# Patient Record
Sex: Male | Born: 1945 | Race: White | Hispanic: No | Marital: Married | Smoking: Former smoker
Health system: Southern US, Community
[De-identification: ages and names within clinical notes are randomized; demographics above are authoritative.]

## PROBLEM LIST (undated history)

## (undated) DIAGNOSIS — J449 Chronic obstructive pulmonary disease, unspecified: Secondary | ICD-10-CM

## (undated) DIAGNOSIS — I4891 Unspecified atrial fibrillation: Secondary | ICD-10-CM

## (undated) DIAGNOSIS — F209 Schizophrenia, unspecified: Secondary | ICD-10-CM

## (undated) DIAGNOSIS — N400 Enlarged prostate without lower urinary tract symptoms: Secondary | ICD-10-CM

## (undated) HISTORY — PX: OTHER SURGICAL HISTORY: SHX169

---

## 2008-05-26 ENCOUNTER — Inpatient Hospital Stay (HOSPITAL_COMMUNITY): Admission: AD | Admit: 2008-05-26 | Discharge: 2008-06-11 | Payer: Self-pay | Admitting: Psychiatry

## 2008-05-26 ENCOUNTER — Ambulatory Visit: Payer: Self-pay | Admitting: Psychiatry

## 2011-04-19 NOTE — Discharge Summary (Signed)
John Jennings, KINNER NO.:  [id number]   MEDICAL RECORD NO.:  [id number]          PATIENT TYPE:  IPS   LOCATION:  0402                          FACILITY:  [facility]   PHYSICIAN:  Anselm Jungling, MD  DATE OF BIRTH:  Nov 24, 1946   DATE OF ADMISSION:  05/26/2008  DATE OF DISCHARGE:  06/11/2008                               DISCHARGE SUMMARY   IDENTIFYING DATA AND REASON FOR ADMISSION:  The patient is a 65 year old  married Caucasian male who was admitted on an involuntary basis.  He had  been referred by his psychiatrist for threatening behaviors towards his  wife.  He had a long history of psychiatric disorder, but had been  stable for many years on a regimen of Prolixin.  His outpatient  psychiatrist felt that he was due for a drug holiday.  This led to  significant psychotic decompensation.  Please refer to the admission  note for further details pertaining to the symptoms, circumstances and  history that led to his hospitalization.  He was given an initial Axis I  diagnosis of psychosis NOS.   MEDICAL AND LABORATORY:  The patient was medically and physically  assessed by the psychiatric nurse practitioner.  He was continued on his  regimen of Actos, Zetia and Zocor.  There were no significant medical  issues during this stay.   HOSPITAL COURSE:  The patient was admitted to the adult inpatient  psychiatric service.  He presented as a well-nourished, normally  developed, elderly male who was alert, directable, but showed pressured  speech, poor hygiene and guarded affect.  He was intermittently  agitated.  His mood was mildly elevated.  His thoughts were disorganized  with religious themes.  He was generally cooperative with treatment.   He was restarted on Prolixin in an oral dose that was eventually  increased to 20 mg daily at bedtime.  He was also initiated on Prolixin  decanoate 50 mg IM q. 2 weeks, Cogentin 1 mg b.i.d. and trazodone 100 mg  daily at bedtime  were given as well.   The patient improved gradually over the following 3 weeks.  His family  was involved, including his wife, son and daughter.   The patient was able to be discharged after approximately 16 days in the  inpatient setting.  At that time, he had been consistently calm,  pleasant and cooperative for many days.  He was not irritable and  appeared to be tolerating his medication well.   AFTERCARE:  The patient was to follow up at [facility] with an appointment on June 13, 2008.   DISCHARGE MEDICATIONS:  1. Prolixin 20 mg daily at bedtime.  2. Prolixin D 50 mg IM q. 2 weeks, next on June 24, 2008.  3. Trazodone 100 mg daily at bedtime.  4. Cogentin 1 mg b.i.d.  5. Actos 15 mg daily.  6. Zetia 10 mg daily.  7. Zocor 40 mg daily.   DISCHARGE DIAGNOSES:  AXIS I:  Schizophrenia, chronic paranoid type,  acute exacerbation, resolving.  AXIS II:  Deferred.  AXIS  III:  History of hypertension, hyperlipidemia.  AXIS IV:  Stressors severe.  AXIS V:  GAF on discharge 50.   The patient was instructed not to drive automobiles following his  hospital stay.  This was written on his discharge instruction sheet.      Anselm Jungling, MD  Electronically Signed     SPB/MEDQ  D:  06/16/2008  T:  06/16/2008  Job:  [id number]

## 2017-11-08 DIAGNOSIS — F0391 Unspecified dementia with behavioral disturbance: Secondary | ICD-10-CM

## 2017-11-08 DIAGNOSIS — R509 Fever, unspecified: Secondary | ICD-10-CM

## 2017-11-08 DIAGNOSIS — R05 Cough: Secondary | ICD-10-CM

## 2017-11-09 DIAGNOSIS — F0391 Unspecified dementia with behavioral disturbance: Secondary | ICD-10-CM | POA: Diagnosis not present

## 2017-11-09 DIAGNOSIS — R509 Fever, unspecified: Secondary | ICD-10-CM | POA: Diagnosis not present

## 2017-11-09 DIAGNOSIS — R05 Cough: Secondary | ICD-10-CM | POA: Diagnosis not present

## 2017-11-10 DIAGNOSIS — R509 Fever, unspecified: Secondary | ICD-10-CM | POA: Diagnosis not present

## 2017-11-10 DIAGNOSIS — F0391 Unspecified dementia with behavioral disturbance: Secondary | ICD-10-CM | POA: Diagnosis not present

## 2017-11-10 DIAGNOSIS — R05 Cough: Secondary | ICD-10-CM | POA: Diagnosis not present

## 2017-11-11 DIAGNOSIS — R509 Fever, unspecified: Secondary | ICD-10-CM

## 2017-12-28 DIAGNOSIS — F0390 Unspecified dementia without behavioral disturbance: Secondary | ICD-10-CM | POA: Diagnosis not present

## 2017-12-28 DIAGNOSIS — E7800 Pure hypercholesterolemia, unspecified: Secondary | ICD-10-CM | POA: Diagnosis not present

## 2017-12-28 DIAGNOSIS — F200 Paranoid schizophrenia: Secondary | ICD-10-CM | POA: Diagnosis not present

## 2017-12-28 DIAGNOSIS — J181 Lobar pneumonia, unspecified organism: Secondary | ICD-10-CM | POA: Diagnosis not present

## 2017-12-28 DIAGNOSIS — J440 Chronic obstructive pulmonary disease with acute lower respiratory infection: Secondary | ICD-10-CM

## 2017-12-28 DIAGNOSIS — J9621 Acute and chronic respiratory failure with hypoxia: Secondary | ICD-10-CM

## 2017-12-28 DIAGNOSIS — E119 Type 2 diabetes mellitus without complications: Secondary | ICD-10-CM | POA: Diagnosis not present

## 2017-12-29 DIAGNOSIS — J9621 Acute and chronic respiratory failure with hypoxia: Secondary | ICD-10-CM | POA: Diagnosis not present

## 2017-12-29 DIAGNOSIS — E119 Type 2 diabetes mellitus without complications: Secondary | ICD-10-CM | POA: Diagnosis not present

## 2017-12-29 DIAGNOSIS — E7800 Pure hypercholesterolemia, unspecified: Secondary | ICD-10-CM | POA: Diagnosis not present

## 2017-12-29 DIAGNOSIS — F0390 Unspecified dementia without behavioral disturbance: Secondary | ICD-10-CM | POA: Diagnosis not present

## 2017-12-29 DIAGNOSIS — F200 Paranoid schizophrenia: Secondary | ICD-10-CM | POA: Diagnosis not present

## 2017-12-29 DIAGNOSIS — J440 Chronic obstructive pulmonary disease with acute lower respiratory infection: Secondary | ICD-10-CM | POA: Diagnosis not present

## 2017-12-29 DIAGNOSIS — J181 Lobar pneumonia, unspecified organism: Secondary | ICD-10-CM | POA: Diagnosis not present

## 2017-12-30 DIAGNOSIS — F0390 Unspecified dementia without behavioral disturbance: Secondary | ICD-10-CM | POA: Diagnosis not present

## 2017-12-30 DIAGNOSIS — E119 Type 2 diabetes mellitus without complications: Secondary | ICD-10-CM | POA: Diagnosis not present

## 2017-12-30 DIAGNOSIS — J440 Chronic obstructive pulmonary disease with acute lower respiratory infection: Secondary | ICD-10-CM | POA: Diagnosis not present

## 2017-12-30 DIAGNOSIS — E7800 Pure hypercholesterolemia, unspecified: Secondary | ICD-10-CM | POA: Diagnosis not present

## 2017-12-30 DIAGNOSIS — F200 Paranoid schizophrenia: Secondary | ICD-10-CM | POA: Diagnosis not present

## 2017-12-30 DIAGNOSIS — J181 Lobar pneumonia, unspecified organism: Secondary | ICD-10-CM | POA: Diagnosis not present

## 2017-12-30 DIAGNOSIS — J9621 Acute and chronic respiratory failure with hypoxia: Secondary | ICD-10-CM | POA: Diagnosis not present

## 2017-12-31 DIAGNOSIS — J9621 Acute and chronic respiratory failure with hypoxia: Secondary | ICD-10-CM | POA: Diagnosis not present

## 2017-12-31 DIAGNOSIS — J181 Lobar pneumonia, unspecified organism: Secondary | ICD-10-CM | POA: Diagnosis not present

## 2017-12-31 DIAGNOSIS — F0390 Unspecified dementia without behavioral disturbance: Secondary | ICD-10-CM | POA: Diagnosis not present

## 2017-12-31 DIAGNOSIS — F200 Paranoid schizophrenia: Secondary | ICD-10-CM | POA: Diagnosis not present

## 2017-12-31 DIAGNOSIS — E119 Type 2 diabetes mellitus without complications: Secondary | ICD-10-CM | POA: Diagnosis not present

## 2017-12-31 DIAGNOSIS — E7800 Pure hypercholesterolemia, unspecified: Secondary | ICD-10-CM | POA: Diagnosis not present

## 2017-12-31 DIAGNOSIS — J440 Chronic obstructive pulmonary disease with acute lower respiratory infection: Secondary | ICD-10-CM | POA: Diagnosis not present

## 2018-03-18 DIAGNOSIS — F200 Paranoid schizophrenia: Secondary | ICD-10-CM | POA: Diagnosis not present

## 2018-03-18 DIAGNOSIS — R079 Chest pain, unspecified: Secondary | ICD-10-CM | POA: Diagnosis not present

## 2018-03-18 DIAGNOSIS — I491 Atrial premature depolarization: Secondary | ICD-10-CM

## 2018-03-18 DIAGNOSIS — J849 Interstitial pulmonary disease, unspecified: Secondary | ICD-10-CM | POA: Diagnosis not present

## 2018-03-18 DIAGNOSIS — R0600 Dyspnea, unspecified: Secondary | ICD-10-CM

## 2018-03-18 DIAGNOSIS — J9611 Chronic respiratory failure with hypoxia: Secondary | ICD-10-CM | POA: Diagnosis not present

## 2018-03-19 DIAGNOSIS — J849 Interstitial pulmonary disease, unspecified: Secondary | ICD-10-CM | POA: Diagnosis not present

## 2018-03-19 DIAGNOSIS — R079 Chest pain, unspecified: Secondary | ICD-10-CM

## 2018-03-19 DIAGNOSIS — F200 Paranoid schizophrenia: Secondary | ICD-10-CM | POA: Diagnosis not present

## 2018-03-19 DIAGNOSIS — R0600 Dyspnea, unspecified: Secondary | ICD-10-CM

## 2018-03-19 DIAGNOSIS — J9611 Chronic respiratory failure with hypoxia: Secondary | ICD-10-CM | POA: Diagnosis not present

## 2018-03-19 DIAGNOSIS — I491 Atrial premature depolarization: Secondary | ICD-10-CM

## 2018-03-20 DIAGNOSIS — F200 Paranoid schizophrenia: Secondary | ICD-10-CM | POA: Diagnosis not present

## 2018-03-20 DIAGNOSIS — J9611 Chronic respiratory failure with hypoxia: Secondary | ICD-10-CM | POA: Diagnosis not present

## 2018-03-20 DIAGNOSIS — R079 Chest pain, unspecified: Secondary | ICD-10-CM | POA: Diagnosis not present

## 2018-03-20 DIAGNOSIS — J849 Interstitial pulmonary disease, unspecified: Secondary | ICD-10-CM | POA: Diagnosis not present

## 2018-06-05 DIAGNOSIS — E119 Type 2 diabetes mellitus without complications: Secondary | ICD-10-CM

## 2018-06-05 DIAGNOSIS — R0609 Other forms of dyspnea: Secondary | ICD-10-CM

## 2018-06-05 DIAGNOSIS — R748 Abnormal levels of other serum enzymes: Secondary | ICD-10-CM

## 2018-06-05 DIAGNOSIS — I509 Heart failure, unspecified: Secondary | ICD-10-CM

## 2018-06-05 DIAGNOSIS — J9621 Acute and chronic respiratory failure with hypoxia: Secondary | ICD-10-CM

## 2018-06-05 DIAGNOSIS — I4892 Unspecified atrial flutter: Secondary | ICD-10-CM

## 2018-06-05 DIAGNOSIS — E872 Acidosis: Secondary | ICD-10-CM

## 2018-06-05 DIAGNOSIS — J449 Chronic obstructive pulmonary disease, unspecified: Secondary | ICD-10-CM

## 2018-06-06 DIAGNOSIS — I4892 Unspecified atrial flutter: Secondary | ICD-10-CM | POA: Diagnosis not present

## 2018-06-06 DIAGNOSIS — J9621 Acute and chronic respiratory failure with hypoxia: Secondary | ICD-10-CM | POA: Diagnosis not present

## 2018-06-06 DIAGNOSIS — I509 Heart failure, unspecified: Secondary | ICD-10-CM | POA: Diagnosis not present

## 2018-06-06 DIAGNOSIS — J449 Chronic obstructive pulmonary disease, unspecified: Secondary | ICD-10-CM | POA: Diagnosis not present

## 2018-06-07 DIAGNOSIS — J449 Chronic obstructive pulmonary disease, unspecified: Secondary | ICD-10-CM | POA: Diagnosis not present

## 2018-06-07 DIAGNOSIS — I4892 Unspecified atrial flutter: Secondary | ICD-10-CM | POA: Diagnosis not present

## 2018-06-07 DIAGNOSIS — I509 Heart failure, unspecified: Secondary | ICD-10-CM | POA: Diagnosis not present

## 2018-06-07 DIAGNOSIS — J9621 Acute and chronic respiratory failure with hypoxia: Secondary | ICD-10-CM | POA: Diagnosis not present

## 2018-08-26 DIAGNOSIS — R0602 Shortness of breath: Secondary | ICD-10-CM | POA: Diagnosis not present

## 2018-08-27 DIAGNOSIS — I350 Nonrheumatic aortic (valve) stenosis: Secondary | ICD-10-CM | POA: Diagnosis not present

## 2018-08-27 DIAGNOSIS — J449 Chronic obstructive pulmonary disease, unspecified: Secondary | ICD-10-CM

## 2018-08-27 DIAGNOSIS — I509 Heart failure, unspecified: Secondary | ICD-10-CM | POA: Diagnosis not present

## 2018-08-27 DIAGNOSIS — I4891 Unspecified atrial fibrillation: Secondary | ICD-10-CM

## 2018-08-27 DIAGNOSIS — R0602 Shortness of breath: Secondary | ICD-10-CM

## 2018-08-27 DIAGNOSIS — I5031 Acute diastolic (congestive) heart failure: Secondary | ICD-10-CM

## 2018-08-27 DIAGNOSIS — J9621 Acute and chronic respiratory failure with hypoxia: Secondary | ICD-10-CM

## 2018-08-27 DIAGNOSIS — R748 Abnormal levels of other serum enzymes: Secondary | ICD-10-CM

## 2018-08-27 DIAGNOSIS — R931 Abnormal findings on diagnostic imaging of heart and coronary circulation: Secondary | ICD-10-CM | POA: Diagnosis not present

## 2018-08-27 DIAGNOSIS — I517 Cardiomegaly: Secondary | ICD-10-CM

## 2018-08-27 DIAGNOSIS — E872 Acidosis: Secondary | ICD-10-CM

## 2018-08-28 DIAGNOSIS — I509 Heart failure, unspecified: Secondary | ICD-10-CM | POA: Diagnosis not present

## 2018-08-28 DIAGNOSIS — F209 Schizophrenia, unspecified: Secondary | ICD-10-CM

## 2018-08-28 DIAGNOSIS — I4891 Unspecified atrial fibrillation: Secondary | ICD-10-CM | POA: Diagnosis not present

## 2018-08-28 DIAGNOSIS — I255 Ischemic cardiomyopathy: Secondary | ICD-10-CM

## 2018-08-28 DIAGNOSIS — R931 Abnormal findings on diagnostic imaging of heart and coronary circulation: Secondary | ICD-10-CM | POA: Diagnosis not present

## 2018-08-29 DIAGNOSIS — F209 Schizophrenia, unspecified: Secondary | ICD-10-CM | POA: Diagnosis not present

## 2018-08-29 DIAGNOSIS — I255 Ischemic cardiomyopathy: Secondary | ICD-10-CM

## 2018-08-29 DIAGNOSIS — I4891 Unspecified atrial fibrillation: Secondary | ICD-10-CM | POA: Diagnosis not present

## 2018-08-29 DIAGNOSIS — I509 Heart failure, unspecified: Secondary | ICD-10-CM

## 2018-08-29 DIAGNOSIS — I471 Supraventricular tachycardia: Secondary | ICD-10-CM

## 2018-08-30 ENCOUNTER — Inpatient Hospital Stay (HOSPITAL_COMMUNITY): Payer: Medicare Other

## 2018-08-30 ENCOUNTER — Inpatient Hospital Stay (HOSPITAL_COMMUNITY)
Admission: AD | Admit: 2018-08-30 | Discharge: 2018-09-07 | DRG: 291 | Disposition: A | Discharge status: Skilled Nursing Facility | Payer: Medicare Other | Source: Skilled Nursing Facility | Attending: Family Medicine | Admitting: Family Medicine

## 2018-08-30 DIAGNOSIS — J9601 Acute respiratory failure with hypoxia: Secondary | ICD-10-CM | POA: Diagnosis not present

## 2018-08-30 DIAGNOSIS — Z8249 Family history of ischemic heart disease and other diseases of the circulatory system: Secondary | ICD-10-CM | POA: Diagnosis not present

## 2018-08-30 DIAGNOSIS — R509 Fever, unspecified: Secondary | ICD-10-CM | POA: Diagnosis not present

## 2018-08-30 DIAGNOSIS — Z9114 Patient's other noncompliance with medication regimen: Secondary | ICD-10-CM

## 2018-08-30 DIAGNOSIS — F329 Major depressive disorder, single episode, unspecified: Secondary | ICD-10-CM | POA: Diagnosis present

## 2018-08-30 DIAGNOSIS — Z66 Do not resuscitate: Secondary | ICD-10-CM | POA: Diagnosis present

## 2018-08-30 DIAGNOSIS — R0602 Shortness of breath: Secondary | ICD-10-CM

## 2018-08-30 DIAGNOSIS — F17210 Nicotine dependence, cigarettes, uncomplicated: Secondary | ICD-10-CM | POA: Diagnosis present

## 2018-08-30 DIAGNOSIS — M24542 Contracture, left hand: Secondary | ICD-10-CM | POA: Diagnosis present

## 2018-08-30 DIAGNOSIS — Z9981 Dependence on supplemental oxygen: Secondary | ICD-10-CM | POA: Diagnosis not present

## 2018-08-30 DIAGNOSIS — I471 Supraventricular tachycardia: Secondary | ICD-10-CM | POA: Diagnosis not present

## 2018-08-30 DIAGNOSIS — J441 Chronic obstructive pulmonary disease with (acute) exacerbation: Secondary | ICD-10-CM | POA: Diagnosis present

## 2018-08-30 DIAGNOSIS — Z79899 Other long term (current) drug therapy: Secondary | ICD-10-CM | POA: Diagnosis not present

## 2018-08-30 DIAGNOSIS — I361 Nonrheumatic tricuspid (valve) insufficiency: Secondary | ICD-10-CM | POA: Diagnosis not present

## 2018-08-30 DIAGNOSIS — F0390 Unspecified dementia without behavioral disturbance: Secondary | ICD-10-CM | POA: Diagnosis present

## 2018-08-30 DIAGNOSIS — I4891 Unspecified atrial fibrillation: Secondary | ICD-10-CM | POA: Diagnosis not present

## 2018-08-30 DIAGNOSIS — J8410 Pulmonary fibrosis, unspecified: Secondary | ICD-10-CM | POA: Diagnosis present

## 2018-08-30 DIAGNOSIS — N400 Enlarged prostate without lower urinary tract symptoms: Secondary | ICD-10-CM | POA: Diagnosis present

## 2018-08-30 DIAGNOSIS — I509 Heart failure, unspecified: Secondary | ICD-10-CM | POA: Insufficient documentation

## 2018-08-30 DIAGNOSIS — F209 Schizophrenia, unspecified: Secondary | ICD-10-CM | POA: Diagnosis present

## 2018-08-30 DIAGNOSIS — I255 Ischemic cardiomyopathy: Secondary | ICD-10-CM | POA: Diagnosis not present

## 2018-08-30 DIAGNOSIS — R7989 Other specified abnormal findings of blood chemistry: Secondary | ICD-10-CM | POA: Diagnosis not present

## 2018-08-30 DIAGNOSIS — E873 Alkalosis: Secondary | ICD-10-CM | POA: Diagnosis present

## 2018-08-30 DIAGNOSIS — Z7901 Long term (current) use of anticoagulants: Secondary | ICD-10-CM | POA: Diagnosis not present

## 2018-08-30 DIAGNOSIS — E119 Type 2 diabetes mellitus without complications: Secondary | ICD-10-CM | POA: Diagnosis present

## 2018-08-30 DIAGNOSIS — I5023 Acute on chronic systolic (congestive) heart failure: Secondary | ICD-10-CM | POA: Diagnosis present

## 2018-08-30 DIAGNOSIS — I480 Paroxysmal atrial fibrillation: Secondary | ICD-10-CM | POA: Diagnosis present

## 2018-08-30 DIAGNOSIS — R4182 Altered mental status, unspecified: Secondary | ICD-10-CM

## 2018-08-30 DIAGNOSIS — J690 Pneumonitis due to inhalation of food and vomit: Secondary | ICD-10-CM | POA: Diagnosis present

## 2018-08-30 DIAGNOSIS — J9621 Acute and chronic respiratory failure with hypoxia: Secondary | ICD-10-CM | POA: Diagnosis present

## 2018-08-30 DIAGNOSIS — Z915 Personal history of self-harm: Secondary | ICD-10-CM | POA: Diagnosis not present

## 2018-08-30 DIAGNOSIS — I248 Other forms of acute ischemic heart disease: Secondary | ICD-10-CM | POA: Diagnosis not present

## 2018-08-30 DIAGNOSIS — Z9119 Patient's noncompliance with other medical treatment and regimen: Secondary | ICD-10-CM

## 2018-08-30 DIAGNOSIS — I700 Atherosclerosis of aorta: Secondary | ICD-10-CM | POA: Diagnosis present

## 2018-08-30 DIAGNOSIS — I5021 Acute systolic (congestive) heart failure: Secondary | ICD-10-CM | POA: Diagnosis not present

## 2018-08-30 DIAGNOSIS — R400 Somnolence: Secondary | ICD-10-CM | POA: Diagnosis not present

## 2018-08-30 DIAGNOSIS — I959 Hypotension, unspecified: Secondary | ICD-10-CM | POA: Diagnosis not present

## 2018-08-30 DIAGNOSIS — Z6838 Body mass index (BMI) 38.0-38.9, adult: Secondary | ICD-10-CM

## 2018-08-30 HISTORY — DX: Unspecified atrial fibrillation: I48.91

## 2018-08-30 HISTORY — DX: Chronic obstructive pulmonary disease, unspecified: J44.9

## 2018-08-30 HISTORY — DX: Benign prostatic hyperplasia without lower urinary tract symptoms: N40.0

## 2018-08-30 HISTORY — DX: Schizophrenia, unspecified: F20.9

## 2018-08-30 LAB — CBC WITH DIFFERENTIAL/PLATELET
ABS IMMATURE GRANULOCYTES: 0 10*3/uL (ref 0.0–0.1)
Basophils Absolute: 0 10*3/uL (ref 0.0–0.1)
Basophils Relative: 0 %
Eosinophils Absolute: 0 10*3/uL (ref 0.0–0.7)
Eosinophils Relative: 0 %
HEMATOCRIT: 40.2 % (ref 39.0–52.0)
HEMOGLOBIN: 12.5 g/dL — AB (ref 13.0–17.0)
IMMATURE GRANULOCYTES: 0 %
LYMPHS ABS: 1.3 10*3/uL (ref 0.7–4.0)
LYMPHS PCT: 13 %
MCH: 31 pg (ref 26.0–34.0)
MCHC: 31.1 g/dL (ref 30.0–36.0)
MCV: 99.8 fL (ref 78.0–100.0)
MONOS PCT: 18 %
Monocytes Absolute: 1.8 10*3/uL — ABNORMAL HIGH (ref 0.1–1.0)
NEUTROS ABS: 6.9 10*3/uL (ref 1.7–7.7)
NEUTROS PCT: 69 %
Platelets: 250 10*3/uL (ref 150–400)
RBC: 4.03 MIL/uL — ABNORMAL LOW (ref 4.22–5.81)
RDW: 16.4 % — ABNORMAL HIGH (ref 11.5–15.5)
WBC: 10.1 10*3/uL (ref 4.0–10.5)

## 2018-08-30 LAB — COMPREHENSIVE METABOLIC PANEL
ALBUMIN: 3.2 g/dL — AB (ref 3.5–5.0)
ALT: 37 U/L (ref 0–44)
ANION GAP: 11 (ref 5–15)
AST: 37 U/L (ref 15–41)
Alkaline Phosphatase: 45 U/L (ref 38–126)
BUN: 18 mg/dL (ref 8–23)
CHLORIDE: 94 mmol/L — AB (ref 98–111)
CO2: 35 mmol/L — ABNORMAL HIGH (ref 22–32)
Calcium: 8.3 mg/dL — ABNORMAL LOW (ref 8.9–10.3)
Creatinine, Ser: 0.99 mg/dL (ref 0.61–1.24)
GFR calc non Af Amer: >60 mL/min (ref >60)
GLUCOSE: 88 mg/dL (ref 70–99)
Potassium: 3.7 mmol/L (ref 3.5–5.1)
SODIUM: 140 mmol/L (ref 135–145)
Total Bilirubin: 1 mg/dL (ref 0.3–1.2)
Total Protein: 6 g/dL — ABNORMAL LOW (ref 6.5–8.1)

## 2018-08-30 LAB — TROPONIN I: Troponin I: 0.06 ng/mL (ref <0.03)

## 2018-08-30 LAB — TSH: TSH: 6.425 u[IU]/mL — ABNORMAL HIGH (ref 0.350–4.500)

## 2018-08-30 MED ORDER — METOPROLOL TARTRATE 50 MG PO TABS
50.0000 mg | ORAL_TABLET | Freq: Two times a day (BID) | ORAL | Status: DC
  Administered 2018-08-30: 50 mg via ORAL
  Filled 2018-08-30: qty 1

## 2018-08-30 MED ORDER — DIVALPROEX SODIUM 125 MG PO CSDR
125.0000 mg | DELAYED_RELEASE_CAPSULE | Freq: Two times a day (BID) | ORAL | Status: DC
  Administered 2018-08-31 – 2018-09-07 (×14): 125 mg via ORAL
  Filled 2018-08-30 (×15): qty 1

## 2018-08-30 MED ORDER — ASPIRIN EC 81 MG PO TBEC
81.0000 mg | DELAYED_RELEASE_TABLET | Freq: Every day | ORAL | Status: DC
  Administered 2018-08-31 – 2018-09-06 (×7): 81 mg via ORAL
  Filled 2018-08-30 (×7): qty 1

## 2018-08-30 MED ORDER — IPRATROPIUM-ALBUTEROL 0.5-2.5 (3) MG/3ML IN SOLN: 3.0000 mL | Freq: Four times a day (QID) | RESPIRATORY_TRACT | Status: DC | PRN

## 2018-08-30 MED ORDER — APIXABAN 5 MG PO TABS
5.0000 mg | ORAL_TABLET | Freq: Two times a day (BID) | ORAL | Status: DC
  Administered 2018-08-30 – 2018-09-07 (×16): 5 mg via ORAL
  Filled 2018-08-30 (×16): qty 1

## 2018-08-30 MED ORDER — AMIODARONE HCL 200 MG PO TABS: 200.0000 mg | ORAL_TABLET | Freq: Every day | ORAL | Status: DC

## 2018-08-30 MED ORDER — DIVALPROEX SODIUM 125 MG PO CSDR
375.0000 mg | DELAYED_RELEASE_CAPSULE | Freq: Every day | ORAL | Status: DC
  Administered 2018-08-30 – 2018-09-06 (×8): 375 mg via ORAL
  Filled 2018-08-30 (×9): qty 3

## 2018-08-30 MED ORDER — SODIUM CHLORIDE 0.9% FLUSH
3.0000 mL | Freq: Two times a day (BID) | INTRAVENOUS | Status: DC
  Administered 2018-08-30 – 2018-09-06 (×11): 3 mL via INTRAVENOUS

## 2018-08-30 MED ORDER — PANTOPRAZOLE SODIUM 40 MG PO TBEC
40.0000 mg | DELAYED_RELEASE_TABLET | Freq: Every day | ORAL | Status: DC
  Administered 2018-08-30 – 2018-09-07 (×9): 40 mg via ORAL
  Filled 2018-08-30 (×9): qty 1

## 2018-08-30 MED ORDER — FUROSEMIDE 10 MG/ML IJ SOLN
40.0000 mg | Freq: Once | INTRAMUSCULAR | Status: AC
  Administered 2018-08-30: 40 mg via INTRAVENOUS
  Filled 2018-08-30: qty 4
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
## 2018-08-30 NOTE — Progress Notes (Signed)
alert and confused with no s/s of distress, Vital signs stable , on box 40. Admissions paged no orders at this time.

## 2018-08-30 NOTE — Progress Notes (Signed)
Pt. With critical troponin of 0.06. On call for [facility] paged to make aware.

## 2018-08-31 ENCOUNTER — Inpatient Hospital Stay (HOSPITAL_COMMUNITY): Payer: Medicare Other

## 2018-08-31 ENCOUNTER — Encounter (HOSPITAL_COMMUNITY): Payer: Self-pay | Admitting: Physician Assistant

## 2018-08-31 DIAGNOSIS — I5021 Acute systolic (congestive) heart failure: Secondary | ICD-10-CM | POA: Diagnosis present

## 2018-08-31 DIAGNOSIS — E119 Type 2 diabetes mellitus without complications: Secondary | ICD-10-CM

## 2018-08-31 DIAGNOSIS — F17210 Nicotine dependence, cigarettes, uncomplicated: Secondary | ICD-10-CM

## 2018-08-31 DIAGNOSIS — F209 Schizophrenia, unspecified: Secondary | ICD-10-CM | POA: Diagnosis present

## 2018-08-31 DIAGNOSIS — R509 Fever, unspecified: Secondary | ICD-10-CM

## 2018-08-31 DIAGNOSIS — J441 Chronic obstructive pulmonary disease with (acute) exacerbation: Secondary | ICD-10-CM

## 2018-08-31 DIAGNOSIS — J9621 Acute and chronic respiratory failure with hypoxia: Secondary | ICD-10-CM | POA: Diagnosis present

## 2018-08-31 DIAGNOSIS — R4182 Altered mental status, unspecified: Secondary | ICD-10-CM

## 2018-08-31 DIAGNOSIS — I361 Nonrheumatic tricuspid (valve) insufficiency: Secondary | ICD-10-CM

## 2018-08-31 LAB — BLOOD GAS, ARTERIAL
Acid-Base Excess: 12.9 mmol/L — ABNORMAL HIGH (ref 0.0–2.0)
Bicarbonate: 37.1 mmol/L — ABNORMAL HIGH (ref 20.0–28.0)
Drawn by: 28340
FIO2: 0.55
O2 Saturation: 92.2 %
Patient temperature: 98.6
pCO2 arterial: 47.8 mmHg (ref 32.0–48.0)
pH, Arterial: 7.501 — ABNORMAL HIGH (ref 7.350–7.450)
pO2, Arterial: 65.5 mmHg — ABNORMAL LOW (ref 83.0–108.0)

## 2018-08-31 LAB — COMPREHENSIVE METABOLIC PANEL
ALBUMIN: 3.2 g/dL — AB (ref 3.5–5.0)
ALK PHOS: 47 U/L (ref 38–126)
ALT: 39 U/L (ref 0–44)
AST: 40 U/L (ref 15–41)
Anion gap: 11 (ref 5–15)
BILIRUBIN TOTAL: 1.2 mg/dL (ref 0.3–1.2)
BUN: 19 mg/dL (ref 8–23)
CALCIUM: 8.4 mg/dL — AB (ref 8.9–10.3)
CO2: 33 mmol/L — ABNORMAL HIGH (ref 22–32)
CREATININE: 1.21 mg/dL (ref 0.61–1.24)
Chloride: 97 mmol/L — ABNORMAL LOW (ref 98–111)
GFR calc Af Amer: >60 mL/min (ref >60)
GFR calc non Af Amer: 58 mL/min — ABNORMAL LOW (ref >60)
GLUCOSE: 123 mg/dL — AB (ref 70–99)
Potassium: 3.5 mmol/L (ref 3.5–5.1)
Sodium: 141 mmol/L (ref 135–145)
TOTAL PROTEIN: 6 g/dL — AB (ref 6.5–8.1)

## 2018-08-31 LAB — URINALYSIS, ROUTINE W REFLEX MICROSCOPIC
Bilirubin Urine: NEGATIVE
GLUCOSE, UA: NEGATIVE mg/dL
HGB URINE DIPSTICK: NEGATIVE
Ketones, ur: NEGATIVE mg/dL
LEUKOCYTES UA: NEGATIVE
Nitrite: NEGATIVE
PH: 9 — AB (ref 5.0–8.0)
PROTEIN: NEGATIVE mg/dL
Specific Gravity, Urine: 1.015 (ref 1.005–1.030)

## 2018-08-31 LAB — BRAIN NATRIURETIC PEPTIDE
B NATRIURETIC PEPTIDE 5: 1293.5 pg/mL — AB (ref 0.0–100.0)
B Natriuretic Peptide: 1635.9 pg/mL — ABNORMAL HIGH (ref 0.0–100.0)

## 2018-08-31 LAB — CBC
HEMATOCRIT: 41.5 % (ref 39.0–52.0)
HEMOGLOBIN: 12.9 g/dL — AB (ref 13.0–17.0)
MCH: 31.2 pg (ref 26.0–34.0)
MCHC: 31.1 g/dL (ref 30.0–36.0)
MCV: 100.2 fL — ABNORMAL HIGH (ref 78.0–100.0)
Platelets: 265 10*3/uL (ref 150–400)
RBC: 4.14 MIL/uL — ABNORMAL LOW (ref 4.22–5.81)
RDW: 16.7 % — ABNORMAL HIGH (ref 11.5–15.5)
WBC: 13.6 10*3/uL — AB (ref 4.0–10.5)

## 2018-08-31 LAB — PROCALCITONIN

## 2018-08-31 LAB — TROPONIN I: Troponin I: 0.04 ng/mL (ref <0.03)

## 2018-08-31 LAB — MRSA PCR SCREENING: MRSA by PCR: NEGATIVE

## 2018-08-31 MED ORDER — LEVALBUTEROL HCL 0.63 MG/3ML IN NEBU: 0.6300 mg | INHALATION_SOLUTION | RESPIRATORY_TRACT | Status: DC | PRN

## 2018-08-31 MED ORDER — NICOTINE 21 MG/24HR TD PT24
21.0000 mg | MEDICATED_PATCH | Freq: Every day | TRANSDERMAL | Status: DC
  Administered 2018-08-31 – 2018-09-07 (×5): 21 mg via TRANSDERMAL
  Filled 2018-08-31 (×7): qty 1

## 2018-08-31 MED ORDER — LEVALBUTEROL HCL 0.63 MG/3ML IN NEBU
0.6300 mg | INHALATION_SOLUTION | Freq: Four times a day (QID) | RESPIRATORY_TRACT | Status: DC
  Administered 2018-08-31 – 2018-09-01 (×7): 0.63 mg via RESPIRATORY_TRACT
  Filled 2018-08-31 (×7): qty 3

## 2018-08-31 MED ORDER — AMIODARONE LOAD VIA INFUSION
150.0000 mg | Freq: Once | INTRAVENOUS | Status: AC
  Administered 2018-08-31: 150 mg via INTRAVENOUS
  Filled 2018-08-31: qty 83.34

## 2018-08-31 MED ORDER — ACETAMINOPHEN 650 MG RE SUPP
650.0000 mg | RECTAL | Status: DC | PRN
  Administered 2018-08-31: 650 mg via RECTAL
  Filled 2018-08-31: qty 1

## 2018-08-31 MED ORDER — AMIODARONE HCL IN DEXTROSE 360-4.14 MG/200ML-% IV SOLN
30.0000 mg/h | INTRAVENOUS | Status: DC
  Administered 2018-08-31 – 2018-09-02 (×6): 30 mg/h via INTRAVENOUS
  Filled 2018-08-31 (×6): qty 200

## 2018-08-31 MED ORDER — INSULIN ASPART 100 UNIT/ML ~~LOC~~ SOLN: 0.0000 [IU] | Freq: Three times a day (TID) | SUBCUTANEOUS | Status: DC

## 2018-08-31 MED ORDER — AMIODARONE HCL IN DEXTROSE 360-4.14 MG/200ML-% IV SOLN
60.0000 mg/h | INTRAVENOUS | Status: DC
  Administered 2018-08-31: 60 mg/h via INTRAVENOUS

## 2018-08-31 MED ORDER — FUROSEMIDE 10 MG/ML IJ SOLN
40.0000 mg | Freq: Two times a day (BID) | INTRAMUSCULAR | Status: DC
  Administered 2018-09-01 – 2018-09-04 (×7): 40 mg via INTRAVENOUS
  Filled 2018-08-31 (×7): qty 4

## 2018-08-31 MED ORDER — FUROSEMIDE 10 MG/ML IJ SOLN
40.0000 mg | Freq: Once | INTRAMUSCULAR | Status: AC
  Administered 2018-08-31: 40 mg via INTRAVENOUS
  Filled 2018-08-31: qty 4

## 2018-08-31 MED ORDER — ARFORMOTEROL TARTRATE 15 MCG/2ML IN NEBU
15.0000 ug | INHALATION_SOLUTION | Freq: Two times a day (BID) | RESPIRATORY_TRACT | Status: DC
  Administered 2018-08-31 – 2018-09-02 (×6): 15 ug via RESPIRATORY_TRACT
  Filled 2018-08-31 (×8): qty 2

## 2018-08-31 MED ORDER — PIPERACILLIN-TAZOBACTAM 3.375 G IVPB
3.3750 g | Freq: Three times a day (TID) | INTRAVENOUS | Status: DC
  Administered 2018-08-31 – 2018-09-02 (×6): 3.375 g via INTRAVENOUS
  Filled 2018-08-31 (×7): qty 50

## 2018-08-31 MED ORDER — AMIODARONE HCL IN DEXTROSE 360-4.14 MG/200ML-% IV SOLN
INTRAVENOUS | Status: AC
  Filled 2018-08-31: qty 200

## 2018-08-31 MED ORDER — BUDESONIDE 0.25 MG/2ML IN SUSP
0.2500 mg | Freq: Two times a day (BID) | RESPIRATORY_TRACT | Status: DC
  Administered 2018-08-31 – 2018-09-07 (×15): 0.25 mg via RESPIRATORY_TRACT
  Filled 2018-08-31 (×15): qty 2

## 2018-08-31 MED ORDER — IPRATROPIUM BROMIDE 0.02 % IN SOLN
0.5000 mg | Freq: Four times a day (QID) | RESPIRATORY_TRACT | Status: DC
  Administered 2018-08-31 – 2018-09-01 (×7): 0.5 mg via RESPIRATORY_TRACT
  Filled 2018-08-31 (×7): qty 2.5

## 2018-08-31 MED ORDER — VANCOMYCIN HCL IN DEXTROSE 750-5 MG/150ML-% IV SOLN
750.0000 mg | Freq: Two times a day (BID) | INTRAVENOUS | Status: DC
  Administered 2018-08-31 – 2018-09-01 (×4): 750 mg via INTRAVENOUS
  Filled 2018-08-31 (×5): qty 150

## 2018-08-31 MED ORDER — INSULIN ASPART 100 UNIT/ML ~~LOC~~ SOLN: 0.0000 [IU] | Freq: Every day | SUBCUTANEOUS | Status: DC

## 2018-08-31 NOTE — Significant Event (Addendum)
Rapid Response Event Note  Overview: Respiratory   Initial Focused Assessment: Called by RNs about patient having low oxygen saturations. Per RN, patient's saturations were in the 70s when they saw the patient, his oxygen was off as well. Per RN, patient is a mouth breather as well. RNs increased oxygen to 6L [place] and saturations were in the mid 80s. I  asked that they place the patient on VM and I that I was on my way.  Upon arrival, patient was alert but is confused (has been since admission, history of schizophrenia). RT was at the bedside, oxygen was increased to 14L 55% via VM and saturations improved into the mid 90s. Crackles throughout, R > L, air movement present bilaterally but very diminished in the lower fields. Not in acute distress but is requiring more oxygen. HR in the 110-130s AF, BP stable. +2 pitting edema in all extremities, visibly overloaded, + cough. Patient was restless at times.   RNs had paged TR[facility] NP prior to my arrival.  Interventions: - STAT CXR  - Lasix 40mg  IV x 1  Plan of Care: - Monitor respiratory status and urinary output - Repeat BNP with morning labs - Low threshold for BIPAP, will need to move to SDU if that is needed.  Event Summary: - I paged [facility] NP at 545 and updated him. Will follow patient as needed.  - I reassesed the patient at 620, appeared more labored, saturations were 86-88% on 14L 55% VM, patient was more restless and confused, I ordered an ABG and called RT. - Paged [facility] NP at 625 with update on patient's status. Patient will not wear a BIPAP, we are struggling with him just to keep a VM on. ABG was reviewed as well. Patient is trying to get up and take his mask off, can be reoriented, poor memory recall, poor safety judgement, delirium ?  - Day [facility] MD to see patient this AM first, plan reviewed with 3E staff.  - SDU coordinated, should it be needed.  Call Time 0438 Arrival Time: 0450 End Time: 0705  Windy Carina

## 2018-08-31 NOTE — Progress Notes (Signed)
Called to patient's room because of low 02 saturations. Patient on VM with 02 saturations at 88%. Increased 02 to 55% VM and patient's 02 saturations increased to 94%. Rapid Response and RN by bedside. Patient had crackles on ausculation. Lasix being given. 02 will be weaned as tolerated.

## 2018-08-31 NOTE — Progress Notes (Signed)
Pt. With O2  of 58%. Pt. Found with O2 off. O2 via nasal cannula reapplied and O2 saturation up to 70s. RRT and RR RN called to patients bedside to assess pt. PT. Placed on venturi mask. O2 saturation up to 90s. On call MD paged to make aware of pt. Status. New orders received. RN will continue to monitor.

## 2018-08-31 NOTE — Progress Notes (Signed)
PT. Code status to be address concerning MOST form. On call for [facility] paged to make aware.

## 2018-08-31 NOTE — Progress Notes (Signed)
Pharmacy Antibiotic Note  John Jennings is a 72 y.o. male admitted on 08/30/2018 with sepsis.  Pharmacy has been consulted for vancomycin dosing.  Already started on IV Zosyn.  Plan: Vancomycin 750 mg IV every 12 hours.  Goal trough 15-20 mcg/mL. F/u cultures, renal function and clinical course.  Vancomycin trough level at steady state as indicated.  Height: 5\' 8"  (172.7 cm) Weight: 255 lb (115.7 kg) IBW/kg (Calculated) : 68.4  Temp (24hrs), Avg:100.2 F (37.9 C), Min:98.2 F (36.8 C), Max:102.2 F (39 C)  Recent Labs  Lab 08/30/18 2126 08/31/18 0549  WBC 10.1 13.6*  CREATININE 0.99 1.21    Estimated Creatinine Clearance: 68.1 mL/min (by C-G formula based on SCr of 1.21 mg/dL).    No Known Allergies  Antimicrobials this admission:  Zosyn 9/30 >> * Vancomycin 9/30 >> *  Dose adjustments this admission:   Microbiology results:  9/30 BCx x 2:  9/30 UCx:  9/30 MRSA PCR: neg  Thank you for allowing pharmacy to be a part of this patient's care.  Jenetta Downer, [facility] Clinical Pharmacist Phone [phone]  08/31/2018 11:09 AM

## 2018-08-31 NOTE — Progress Notes (Signed)
[facility]                                                                              Patient Demographics  John Jennings, is a 72 y.o. male, DOB - 30-Jul-1946, ZOX:[id number]  Admit date - 08/30/2018   Admitting Physician Shaterica Mcclatchy Jenna Luo, MD  Outpatient Primary MD for the patient is Shelbie Ammons, MD  Outpatient specialists:   LOS - 1  days   Medical records reviewed and are as summarized below:    No chief complaint on file.      Brief summary   Patient is a 72 year old male with obesity, schizophrenia on antipsychotics, systolic CHF, chronic hypoxic respiratory failure (per daughter on 4 L O2), smoker, suspected underlying COPD, atrial fibrillation presented as a transfer from [facility] where he was admitted on 9/26 with acute hypoxic respiratory failure. Hospital course remarkable for discovery of acutely worsened EF down to 20% (earlier in the year was 50%), atrial fibrillation with rapid ventricular rate with amiodarone initiation, lactic acidosis that normalized, initiation of anticoagulation with Eliquis, diuresis therapy, and medication nonadherence culminating in discussion with the patient's daughter resulting in decision to transfer to [facility] for further care specifically for consideration of cardiac catheterization as well as potentially psychiatry consult to optimize his mental health disorder.   Assessment & Plan    Principal Problem:   Acute on chronic respiratory failure with hypoxia (HCC) likely due to acute systolic CHF and COPD exacerbation.  Likely has underlying OSA and OHS -Patient seen twice this morning for rapid responses.  First seen around 8 AM, patient was having significant shortness of breath with hypoxia, on Ventimask, rapid atrial fibrillation with heart rate 110-130's.  Overnight events were noted.  On lung exam, scattered wheezing with decreased breath sounds throughout, bibasilar Rales -ABG consistent with respiratory  alkalosis, BNP 1635, elevated troponin 0.04, chest x-ray consistent with interstitial edema -Patient had received Lasix 40 mg IV x1.  Cardiology consult was called -Temp 101, ordered procalcitonin stat blood cultures, UA and culture, possibly may have had aspiration, placed on IV Zosyn -Patient seen and examined again around 10:15am, rapid response called again for hypotension with BP in 60s, heart rate 128.  On exam, patient alert and oriented, close to his baseline, BP improved to 106/72 on high flow O2 15 L, O2 sats 88-92% -Started on IV amiodarone drip with bolus and infusion, unable to give more Lasix or beta-blocker secondary to soft BP -Notified cardiology about starting amiodarone drip.  Patient continues to spike fevers, also added vancomycin for broad spectrum coverage -Continue Eliquis  Active Problems:   Acute systolic CHF (congestive heart failure) (HCC) -BNP 1635, chest x-ray consistent with interstitial edema -Patient received Lasix 40 mg IV x1 this morning, placed on Lasix 40 mg IV every 12 hours -Hold off on beta-blocker as BP is soft, placed on IV amiodarone drip, cardiology consulted -Patient had 2D echo at [facility] which showed new drop in EF of 20%.  -Continue strict I's and O's and daily weights    COPD with acute exacerbation (HCC) -Per daughter at the bedside, smoker and has history  of COPD -Currently scattered wheezing, placed on Xopenex and Atrovent scheduled nebs, Pulmicort, Brovana, antibiotics, nicotine patch     Fever -Unclear etiology, may have aspirated, has leukocytosis -Obtain procalcitonin, UA and culture, blood cultures -Chest x-ray this morning shows interstitial edema -For now placed on IV vancomycin and Zosyn, identify source     Schizophrenia [facility]) -Per [facility] MD, patient had refused to treatment, medications, needs inpatient psych evaluation -Psych consult placed, continue Depakote for now  Nicotine abuse Placed on a nicotine  patch  Obesity BMI 38.7, outpatient management of diet and weight control recommended   Code Status: DNR, discussed with patient's daughter at the bedside.   DVT Prophylaxis: Eliquis Family Communication: Discussed in detail with the patient, all imaging results, lab results explained to the patient and daughter at the bedside   Disposition Plan: Transfer to stepdown due to high acuity, high risk of deterioration, started on IV amiodarone drip  Critical care time Spent in minutes    Procedures:  None   Consultants:    cardiology Antimicrobials:   IV vancomycin 9/30  IV Zosyn 9/30   Medications  Scheduled Meds: . amiodarone  150 mg Intravenous Once  . apixaban  5 mg Oral BID  . arformoterol  15 mcg Nebulization BID  . aspirin EC  81 mg Oral Daily  . budesonide (PULMICORT) nebulizer solution  0.25 mg Nebulization BID  . divalproex  125 mg Oral Q12H  . divalproex  375 mg Oral QHS  . furosemide  40 mg Intravenous Q12H  . levalbuterol  0.63 mg Nebulization Q6H   And  . ipratropium  0.5 mg Nebulization Q6H  . nicotine  21 mg Transdermal Daily  . pantoprazole  40 mg Oral Daily  . sodium chloride flush  3 mL Intravenous Q12H   Continuous Infusions: . amiodarone 60 mg/hr (08/31/18 1035)   Followed by  . amiodarone    . amiodarone    . piperacillin-tazobactam (ZOSYN)  IV     PRN Meds:.acetaminophen, levalbuterol   Antibiotics   Anti-infectives (From admission, onward)   Start     Dose/Rate Route Frequency Ordered Stop   08/31/18 0900  piperacillin-tazobactam (ZOSYN) IVPB 3.375 g     3.375 g 12.5 mL/hr over 240 Minutes Intravenous Every 8 hours 08/31/18 0844          Subjective:   John Jennings was seen and examined today.  Seen and examined twice this morning, multiple rapid responses.  Overnight events noted.  High O2 requirements now placed on 15 L high flow.  In rapid atrial fibrillation with RVR denies any chest pain.  No nausea vomiting abdominal  pain or diarrhea  Objective:   Vitals:   08/31/18 1010 08/31/18 1018 08/31/18 1020 08/31/18 1025  BP: (!) 65/39 100/70 106/72   Pulse:      Resp: (!) 21 18 16 16   Temp:      TempSrc:      SpO2: 90% 93%  91%  Weight:      Height:        Intake/Output Summary (Last 24 hours) at 08/31/2018 1046 Last data filed at 08/31/2018 1039 Gross per 24 hour  Intake 14.58 ml  Output 3150 ml  Net -3135.42 ml     Wt Readings from Last 3 Encounters:  08/31/18 115.7 kg     Exam  General: Alert and awake, on Ventimask--> now transitioned to high flow O2, oriented  Eyes:  HEENT:  Atraumatic, normocephalic  Cardiovascular: S1 S2  auscultated, irregularly irregular, tachycardia, 1+ pitting edema  Respiratory: Decreased breath sounds throughout with scattered wheezing and bibasilar Rales  Gastrointestinal: Morbidly obese soft, nontender, nondistended, + bowel sounds  Ext: 1+ pedal edema bilaterally  Neuro: moving all 4 extremities  Musculoskeletal: No digital cyanosis, clubbing  Skin: No rashes  Psych: flat affect   Data Reviewed:  I have personally reviewed following labs and imaging studies  Micro Results Recent Results (from the past 240 hour(s))  MRSA PCR Screening     Status: None   Collection Time: 08/31/18  4:19 AM  Result Value Ref Range Status   MRSA by PCR NEGATIVE NEGATIVE Final    Comment:        The GeneXpert MRSA Assay (FDA approved for NASAL specimens only), is one component of a comprehensive MRSA colonization surveillance program. It is not intended to diagnose MRSA infection nor to guide or monitor treatment for MRSA infections. Performed at [facility], [address], [place], [place] [address]     Radiology Reports X-ray Chest Pa And Lateral  Result Date: 08/30/2018 CLINICAL DATA:  Shortness of breath EXAM: CHEST - 2 VIEW COMPARISON:  08/27/2018, 06/05/2018, CT chest 03/18/2018 FINDINGS: Cardiomegaly. Small pleural effusions. Bilateral  pulmonary fibrosis. There may be slight increased degree of interstitial prominence. No pneumothorax. IMPRESSION: 1. Cardiomegaly with small pleural effusions. 2. Diffusely increased interstitial opacity compatible with underlying fibrosis. Difficult to exclude mild acute superimposed edema. Electronically Signed   By: Jasmine Pang M.D.   On: 08/30/2018 22:53   Dg Chest Port 1 View  Result Date: 08/31/2018 CLINICAL DATA:  Shortness of breath. History of CHF. EXAM: PORTABLE CHEST 1 VIEW COMPARISON:  Chest radiograph August 30, 2018 FINDINGS: Stable cardiomegaly. Mediastinal silhouette is not suspicious. Mildly calcified aortic arch. Diffuse interstitial prominence similar to prior examination. Small RIGHT pleural effusion. LEFT lung base granuloma. No pneumothorax. Soft tissue planes and included osseous structures are unchanged. IMPRESSION: Stable cardiomegaly and interstitial edema with small RIGHT pleural effusion. Aortic Atherosclerosis (ICD10-I70.0). Electronically Signed   By: Awilda Metro M.D.   On: 08/31/2018 05:45    Lab Data:  CBC: Recent Labs  Lab 08/30/18 2126 08/31/18 0549  WBC 10.1 13.6*  NEUTROABS 6.9  --   HGB 12.5* 12.9*  HCT 40.2 41.5  MCV 99.8 100.2*  PLT 250 265   Basic Metabolic Panel: Recent Labs  Lab 08/30/18 2126 08/31/18 0549  NA 140 141  K 3.7 3.5  CL 94* 97*  CO2 35* 33*  GLUCOSE 88 123*  BUN 18 19  CREATININE 0.99 1.21  CALCIUM 8.3* 8.4*   GFR: Estimated Creatinine Clearance: 68.1 mL/min (by C-G formula based on SCr of 1.21 mg/dL). Liver Function Tests: Recent Labs  Lab 08/30/18 2126 08/31/18 0549  AST 37 40  ALT 37 39  ALKPHOS 45 47  BILITOT 1.0 1.2  PROT 6.0* 6.0*  ALBUMIN 3.2* 3.2*   No results for input(s): LIPASE, AMYLASE in the last 168 hours. No results for input(s): AMMONIA in the last 168 hours. Coagulation Profile: No results for input(s): INR, PROTIME in the last 168 hours. Cardiac Enzymes: Recent Labs  Lab  08/30/18 2126 08/31/18 0549  TROPONINI 0.06* 0.04*   BNP (last 3 results) No results for input(s): PROBNP in the last 8760 hours. HbA1C: No results for input(s): HGBA1C in the last 72 hours. CBG: No results for input(s): GLUCAP in the last 168 hours. Lipid Profile: No results for input(s): CHOL, HDL, LDLCALC, TRIG, CHOLHDL, LDLDIRECT in  the last 72 hours. Thyroid Function Tests: Recent Labs    08/30/18 2126  TSH 6.425*   Anemia Panel: No results for input(s): VITAMINB12, FOLATE, FERRITIN, TIBC, IRON, RETICCTPCT in the last 72 hours. Urine analysis: No results found for: COLORURINE, APPEARANCEUR, LABSPEC, PHURINE, GLUCOSEU, HGBUR, BILIRUBINUR, KETONESUR, PROTEINUR, UROBILINOGEN, NITRITE, LEUKOCYTESUR   Jatasia Gundrum M.D. [facility] 08/31/2018, 10:46 AM  Pager: [phone] Between 7am to 7pm - call Pager - [phone]  After 7pm go to [email or web address] - password [email or web address]  Call night coverage person covering after 7pm
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
## 2018-08-31 NOTE — Progress Notes (Signed)
Patient arrived from 3 east to room 4e07. Presssure 65/39 patient on high flow [place] at 15 L. Temp 101.7 rectally . Dr. Isidoro Donning aware of patient status, Rapid response in room. Will continue to monitor patient. Arnesha Schiraldi, Randall An RN

## 2018-08-31 NOTE — Progress Notes (Signed)
  Echocardiogram 2D Echocardiogram has been performed.  John Jennings 08/31/2018, 2:52 PM

## 2018-09-01 DIAGNOSIS — R400 Somnolence: Secondary | ICD-10-CM

## 2018-09-01 DIAGNOSIS — I480 Paroxysmal atrial fibrillation: Secondary | ICD-10-CM

## 2018-09-01 LAB — CBC
HEMATOCRIT: 40.3 % (ref 39.0–52.0)
Hemoglobin: 12.3 g/dL — ABNORMAL LOW (ref 13.0–17.0)
MCH: 30.5 pg (ref 26.0–34.0)
MCHC: 30.5 g/dL (ref 30.0–36.0)
MCV: 100 fL (ref 78.0–100.0)
Platelets: 232 10*3/uL (ref 150–400)
RBC: 4.03 MIL/uL — ABNORMAL LOW (ref 4.22–5.81)
RDW: 16.4 % — AB (ref 11.5–15.5)
WBC: 12.5 10*3/uL — AB (ref 4.0–10.5)

## 2018-09-01 LAB — BASIC METABOLIC PANEL
ANION GAP: 10 (ref 5–15)
BUN: 17 mg/dL (ref 8–23)
CO2: 35 mmol/L — ABNORMAL HIGH (ref 22–32)
Calcium: 8.1 mg/dL — ABNORMAL LOW (ref 8.9–10.3)
Chloride: 94 mmol/L — ABNORMAL LOW (ref 98–111)
Creatinine, Ser: 1.28 mg/dL — ABNORMAL HIGH (ref 0.61–1.24)
GFR calc Af Amer: >60 mL/min (ref >60)
GFR, EST NON AFRICAN AMERICAN: 54 mL/min — AB (ref >60)
Glucose, Bld: 100 mg/dL — ABNORMAL HIGH (ref 70–99)
POTASSIUM: 4 mmol/L (ref 3.5–5.1)
SODIUM: 139 mmol/L (ref 135–145)

## 2018-09-01 LAB — URINE CULTURE: Culture: NO GROWTH

## 2018-09-01 LAB — HEMOGLOBIN A1C
Hgb A1c MFr Bld: 6.4 % — ABNORMAL HIGH (ref 4.8–5.6)
MEAN PLASMA GLUCOSE: 137 mg/dL

## 2018-09-01 MED ORDER — ACETAMINOPHEN 325 MG PO TABS
650.0000 mg | ORAL_TABLET | Freq: Four times a day (QID) | ORAL | Status: DC | PRN
  Administered 2018-09-01: 650 mg via ORAL
  Filled 2018-09-01: qty 2
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
## 2018-09-01 NOTE — Progress Notes (Signed)
Progress Note  Patient Name: John Jennings Date of Encounter: 09/01/2018  Primary Cardiologist: No primary care provider on file.   Subjective   John Jennings appears to have clinically improved today.  He was placed on BiPAP overnight, and today is on high flow oxygen 10 L/min.  He was sleeping for my exam and did not readily respond to my verbal prompts.   Inpatient Medications    Scheduled Meds: . apixaban  5 mg Oral BID  . arformoterol  15 mcg Nebulization BID  . aspirin EC  81 mg Oral Daily  . budesonide (PULMICORT) nebulizer solution  0.25 mg Nebulization BID  . divalproex  125 mg Oral Q12H  . divalproex  375 mg Oral QHS  . furosemide  40 mg Intravenous Q12H  . levalbuterol  0.63 mg Nebulization Q6H   And  . ipratropium  0.5 mg Nebulization Q6H  . nicotine  21 mg Transdermal Daily  . pantoprazole  40 mg Oral Daily  . sodium chloride flush  3 mL Intravenous Q12H   Continuous Infusions: . amiodarone 30 mg/hr (09/01/18 1137)  . piperacillin-tazobactam (ZOSYN)  IV 3.375 g (09/01/18 0941)  . vancomycin 750 mg (09/01/18 1148)   PRN Meds: acetaminophen, levalbuterol   Vital Signs    Vitals:   09/01/18 0909 09/01/18 0911 09/01/18 0914 09/01/18 1100  BP:    99/61  Pulse:    85  Resp:    20  Temp:      TempSrc:      SpO2: 92% 99% 92% 94%  Weight:      Height:        Intake/Output Summary (Last 24 hours) at 09/01/2018 1349 Last data filed at 09/01/2018 0508 Gross per 24 hour  Intake 749.91 ml  Output 1100 ml  Net -350.09 ml   Filed Weights   08/30/18 1813 08/31/18 0300  Weight: 115.4 kg 115.7 kg    Telemetry    Atrial fibrillation with occasional ectopic beats versus aberrancy- Personally Reviewed  ECG    Atrial fibrillation with rapid ventricular response. LAFB - Personally Reviewed  Physical Exam   GEN: No acute distress, sleeping and breathing comfortably.   Neck:  Difficult to assess JVP due to facial hair Cardiac:  Irregular rhythm, normal  rate, no murmurs Respiratory: Clear to auscultation bilaterally. GI: Soft, nontender, non-distended  MS: No edema; No deformity. Neuro:  Nonfocal  Psych: Normal affect   Labs    Chemistry Recent Labs  Lab 08/30/18 2126 08/31/18 0549 09/01/18 0317  NA 140 141 139  K 3.7 3.5 4.0  CL 94* 97* 94*  CO2 35* 33* 35*  GLUCOSE 88 123* 100*  BUN 18 19 17   CREATININE 0.99 1.21 1.28*  CALCIUM 8.3* 8.4* 8.1*  PROT 6.0* 6.0*  --   ALBUMIN 3.2* 3.2*  --   AST 37 40  --   ALT 37 39  --   ALKPHOS 45 47  --   BILITOT 1.0 1.2  --   GFRNONAA >60 58* 54*  GFRAA >60 >60 >60  ANIONGAP 11 11 10      Hematology Recent Labs  Lab 08/30/18 2126 08/31/18 0549 09/01/18 0317  WBC 10.1 13.6* 12.5*  RBC 4.03* 4.14* 4.03*  HGB 12.5* 12.9* 12.3*  HCT 40.2 41.5 40.3  MCV 99.8 100.2* 100.0  MCH 31.0 31.2 30.5  MCHC 31.1 31.1 30.5  RDW 16.4* 16.7* 16.4*  PLT 250 265 232    Cardiac Enzymes Recent Labs  Lab 08/30/18  2126 08/31/18 0549  TROPONINI 0.06* 0.04*   No results for input(s): TROPIPOC in the last 168 hours.   BNP Recent Labs  Lab 08/30/18 2126 08/31/18 0549  BNP 1,293.5* 1,635.9*     DDimer No results for input(s): DDIMER in the last 168 hours.   Radiology    X-ray Chest Pa And Lateral  Result Date: 08/30/2018 CLINICAL DATA:  Shortness of breath EXAM: CHEST - 2 VIEW COMPARISON:  08/27/2018, 06/05/2018, CT chest 03/18/2018 FINDINGS: Cardiomegaly. Small pleural effusions. Bilateral pulmonary fibrosis. There may be slight increased degree of interstitial prominence. No pneumothorax. IMPRESSION: 1. Cardiomegaly with small pleural effusions. 2. Diffusely increased interstitial opacity compatible with underlying fibrosis. Difficult to exclude mild acute superimposed edema. Electronically Signed   By: Jasmine Pang M.D.   On: 08/30/2018 22:53   Dg Chest Port 1 View  Result Date: 08/31/2018 CLINICAL DATA:  Shortness of breath. History of CHF. EXAM: PORTABLE CHEST 1 VIEW  COMPARISON:  Chest radiograph August 30, 2018 FINDINGS: Stable cardiomegaly. Mediastinal silhouette is not suspicious. Mildly calcified aortic arch. Diffuse interstitial prominence similar to prior examination. Small RIGHT pleural effusion. LEFT lung base granuloma. No pneumothorax. Soft tissue planes and included osseous structures are unchanged. IMPRESSION: Stable cardiomegaly and interstitial edema with small RIGHT pleural effusion. Aortic Atherosclerosis (ICD10-I70.0). Electronically Signed   By: Awilda Metro M.D.   On: 08/31/2018 05:45    Patient Profile     John Jennings is a 72 y.o. male with a hx of schizophrenia, chronic hypoxic respiratory failure on baseline home O2, DM 2, BPH, dementia, depression and COPD who is being seen for the evaluation of afib with RVR and acute CHF at the request of Internal Medicine.  Assessment & Plan    Principal Problem:   Altered mental status Active Problems:   Acute systolic CHF (congestive heart failure) (HCC)   Acute on chronic respiratory failure with hypoxia (HCC)   COPD with acute exacerbation (HCC)   Fever   Schizophrenia (HCC)   Paroxysmal atrial fibrillation [facility])  John Jennings appears improved as compared to his presentation yesterday, particularly from a respi think she is ratory standpoint.  In addition his atrial fibrillation rate is well controlled.  He is experiencing hypotension, and based on his nurses description there may be an autonomic component to positional hypotension.  On reviewing his serial chest x-rays his pulmonary interstitial edema is improving, and I believe if he will tolerate we should continue IV Lasix today.  With his renal dysfunction, I will maintain a dose of 40 mg of IV Lasix if tolerated from a blood pressure standpoint so that we may have an adequate response.  His heart failure remains diagnostically challenging, however he is not an optimal candidate for cardiac catheterization to evaluate for an  ischemic source.  At present we should pursue goal-directed medical therapy for heart failure.  Hypertension will limit [place] slightly however he should be initiated on an ACE inhibitor or ARB, and should also be initiated on a low-dose of metoprolol.  Would be reasonable to transition from IV to oral amiodarone tomorrow (10/2) to finish an approximate 5 gram load, with dosing of 400 mg BID. Can continue IV amio today. Continue Eliquis.    For questions or updates, please contact [facility] Please consult [email or web address] for contact info under     Signed, Parke Poisson, MD  09/01/2018, 1:49 PM

## 2018-09-01 NOTE — Progress Notes (Signed)
RT weaned salter HFNC from 15lpm to 12lpm per patient's spo2 98%. Patient is tolerating well with spo2 96% at this time. RT will continue to monitor as needed.

## 2018-09-01 NOTE — Progress Notes (Signed)
Pt taken off BIPAP and placed on 10L HFNC and tolerating well at this time.  RT will continue to monitor.

## 2018-09-01 NOTE — Progress Notes (Signed)
Increased patient's HFNC to 15 LPM due to patient SPO2 in low 80s.

## 2018-09-01 NOTE — Progress Notes (Signed)
Patient's spo2 dropped to 75% on 15L HFNC(salter). RN called RT to place patient on BIPAP. RT set up BIPAP and placed on patient. Spo2 increased to 100% on fio2 60%. Patient is tolerating well at this time. RN is aware. RT will monitor as needed.

## 2018-09-01 NOTE — Progress Notes (Signed)
[facility]                                                                              Patient Demographics  John Jennings, is a 72 y.o. male, DOB - 02-14-1946, GNF:[id number]  Admit date - 08/30/2018   Admitting Physician Ripudeep Jenna Luo, MD  Outpatient Primary MD for the patient is Shelbie Ammons, MD  Outpatient specialists:   LOS - 2  days   Medical records reviewed and are as summarized below:    No chief complaint on file.      Brief summary   Patient is a 72 year old male with obesity, schizophrenia on antipsychotics, systolic CHF, chronic hypoxic respiratory failure (per daughter on 4 L O2), smoker, suspected underlying COPD, atrial fibrillation presented as a transfer from [facility] where he was admitted on 9/26 with acute hypoxic respiratory failure. Hospital course remarkable for discovery of acutely worsened EF down to 20% (earlier in the year was 50%), atrial fibrillation with rapid ventricular rate with amiodarone initiation, lactic acidosis that normalized, initiation of anticoagulation with Eliquis, diuresis therapy, and medication nonadherence culminating in discussion with the patient's daughter resulting in decision to transfer to [facility] for further care specifically for consideration of cardiac catheterization as well as potentially psychiatry consult to optimize his mental health disorder.   Assessment & Plan    Principal Problem:   Acute on chronic respiratory failure with hypoxia (HCC) likely due to acute systolic CHF and COPD exacerbation.  Likely has underlying OSA and OHS -Overnight placed on BiPAP, at the time of my encounter, BiPAP off, placed on 10 L high flow O2.  -Currently O2 sats maintaining 92% on 10 L high flow -Afebrile this morning, UA negative, leukocytosis trending down, blood cultures negative so far -Due to concern of sepsis/fevers of unknown origin, patient was started on broad-spectrum antibiotics on  9/30.  Active Problems:   Acute systolic CHF (congestive heart failure) (HCC), atrial fib with RVR -Heart rate better controlled this morning, currently on amiodarone drip  -Has not been able to tolerate IV Lasix doses due to borderline hypotension -Cardiology following, currently negative balance of 3.4 L -Patient had 2D echo at [facility] which showed new drop in EF of 20%.  -Continue strict I's and O's and daily weights -Continue Eliquis    COPD with acute exacerbation (HCC) -Scattered wheezing, continue Xopenex, Atrovent nebs, Pulmicort, Brovana, antibiotics  -BiPAP as needed      Fever -Unclear etiology, may have aspirated, leukocytosis trending down  -Procalcitonin less than 0.1, UA negative, follow blood cultures so far negative  -Afebrile today.  Likely DC antibiotics if no fevers in the next 24 hours.    Schizophrenia [facility]) -Per [facility] MD, patient had refused to treatment, medications, needs inpatient psych evaluation -Psychiatry was consulted, recommended Depakote and verify outpatient meds, pharmacy consult placed  Nicotine abuse Placed on a nicotine patch  Obesity BMI 38.7, outpatient management of diet and weight control recommended   Code Status: DNR, discussed with patient's daughter at the bedside.   DVT Prophylaxis: Eliquis Family Communication: Discussed in detail with the patient, all imaging results, lab results  explained to the patient.  Discussed with daughter on 9/30 at the bedside   Disposition Plan: Continue to monitor in stepdown unit, on amiodarone drip, high risk of deterioration, needing BiPAP  Critical care time Spent in minutes 35 minutes  Procedures:  None   Consultants:    cardiology Antimicrobials:   IV vancomycin 9/30  IV Zosyn 9/30   Medications  Scheduled Meds: . apixaban  5 mg Oral BID  . arformoterol  15 mcg Nebulization BID  . aspirin EC  81 mg Oral Daily  . budesonide (PULMICORT) nebulizer solution  0.25 mg  Nebulization BID  . divalproex  125 mg Oral Q12H  . divalproex  375 mg Oral QHS  . furosemide  40 mg Intravenous Q12H  . levalbuterol  0.63 mg Nebulization Q6H   And  . ipratropium  0.5 mg Nebulization Q6H  . nicotine  21 mg Transdermal Daily  . pantoprazole  40 mg Oral Daily  . sodium chloride flush  3 mL Intravenous Q12H   Continuous Infusions: . amiodarone 30 mg/hr (09/01/18 1137)  . piperacillin-tazobactam (ZOSYN)  IV 3.375 g (09/01/18 0941)  . vancomycin 750 mg (09/01/18 1148)   PRN Meds:.acetaminophen, levalbuterol   Antibiotics   Anti-infectives (From admission, onward)   Start     Dose/Rate Route Frequency Ordered Stop   08/31/18 1100  vancomycin (VANCOCIN) IVPB 750 mg/150 ml premix     750 mg 150 mL/hr over 60 Minutes Intravenous Every 12 hours 08/31/18 1053     08/31/18 0900  piperacillin-tazobactam (ZOSYN) IVPB 3.375 g     3.375 g 12.5 mL/hr over 240 Minutes Intravenous Every 8 hours 08/31/18 0844          Subjective:   John Jennings was seen and examined today.  Seen and examined today, feeling slightly better from yesterday.  BiPAP off.  Denies any chest pain, abdominal pain.  Heart rate better controlled today.  No fevers.  Objective:   Vitals:   09/01/18 0909 09/01/18 0911 09/01/18 0914 09/01/18 1100  BP:    99/61  Pulse:    85  Resp:    20  Temp:      TempSrc:      SpO2: 92% 99% 92% 94%  Weight:      Height:        Intake/Output Summary (Last 24 hours) at 09/01/2018 1227 Last data filed at 09/01/2018 1610 Gross per 24 hour  Intake 749.91 ml  Output 1100 ml  Net -350.09 ml     Wt Readings from Last 3 Encounters:  08/31/18 115.7 kg     Exam    General: Alert and oriented to self, NAD poor historian  Eyes:   HEENT:    Cardiovascular: S1 S2 auscultated, ireg ireg  Respiratory: bibasilar rales   Gastrointestinal: obese, Soft, nontender, nondistended, + bowel sounds  Ext: no pedal edema bilaterally  Neuro:   Musculoskeletal:  No digital cyanosis, clubbing  Skin: No rashes  Psych: flat affect    Data Reviewed:  I have personally reviewed following labs and imaging studies  Micro Results Recent Results (from the past 240 hour(s))  MRSA PCR Screening     Status: None   Collection Time: 08/31/18  4:19 AM  Result Value Ref Range Status   MRSA by PCR NEGATIVE NEGATIVE Final    Comment:        The GeneXpert MRSA Assay (FDA approved for NASAL specimens only), is one component of a comprehensive MRSA colonization surveillance program. It  is not intended to diagnose MRSA infection nor to guide or monitor treatment for MRSA infections. Performed at  [facility], [address], [place], [place] [address]   Culture, blood (routine x 2)     Status: None (Preliminary result)   Collection Time: 08/31/18 10:05 AM  Result Value Ref Range Status   Specimen Description BLOOD THUMB  Final   Special Requests   Final    BOTTLES DRAWN AEROBIC AND ANAEROBIC Blood Culture adequate volume   Culture   Final    NO GROWTH < 24 HOURS Performed at [facility], [address] [place], [place] [address]    Report Status PENDING  Incomplete  Culture, blood (routine x 2)     Status: None (Preliminary result)   Collection Time: 08/31/18 10:43 AM  Result Value Ref Range Status   Specimen Description BLOOD BLOOD LEFT HAND  Final   Special Requests   Final    BOTTLES DRAWN AEROBIC AND ANAEROBIC Blood Culture adequate volume   Culture   Final    NO GROWTH < 24 HOURS Performed at [facility], [address] [place], [place] [address]    Report Status PENDING  Incomplete  Urine Culture     Status: None   Collection Time: 08/31/18 10:45 AM  Result Value Ref Range Status   Specimen Description URINE, CATHETERIZED  Final   Special Requests NONE  Final   Culture   Final    NO GROWTH Performed at [facility], [address] [place], [place] [address]    Report Status 09/01/2018 FINAL  Final     Radiology Reports X-ray Chest Pa And Lateral  Result Date: 08/30/2018 CLINICAL DATA:  Shortness of breath EXAM: CHEST - 2 VIEW COMPARISON:  08/27/2018, 06/05/2018, CT chest 03/18/2018 FINDINGS: Cardiomegaly. Small pleural effusions. Bilateral pulmonary fibrosis. There may be slight increased degree of interstitial prominence. No pneumothorax. IMPRESSION: 1. Cardiomegaly with small pleural effusions. 2. Diffusely increased interstitial opacity compatible with underlying fibrosis. Difficult to exclude mild acute superimposed edema. Electronically Signed   By: Jasmine Pang M.D.   On: 08/30/2018 22:53   Dg Chest Port 1 View  Result Date: 08/31/2018 CLINICAL DATA:  Shortness of breath. History of CHF. EXAM: PORTABLE CHEST 1 VIEW COMPARISON:  Chest radiograph August 30, 2018 FINDINGS: Stable cardiomegaly. Mediastinal silhouette is not suspicious. Mildly calcified aortic arch. Diffuse interstitial prominence similar to prior examination. Small RIGHT pleural effusion. LEFT lung base granuloma. No pneumothorax. Soft tissue planes and included osseous structures are unchanged. IMPRESSION: Stable cardiomegaly and interstitial edema with small RIGHT pleural effusion. Aortic Atherosclerosis (ICD10-I70.0). Electronically Signed   By: Awilda Metro M.D.   On: 08/31/2018 05:45    Lab Data:  CBC: Recent Labs  Lab 08/30/18 2126 08/31/18 0549 09/01/18 0317  WBC 10.1 13.6* 12.5*  NEUTROABS 6.9  --   --   HGB 12.5* 12.9* 12.3*  HCT 40.2 41.5 40.3  MCV 99.8 100.2* 100.0  PLT 250 265 232   Basic Metabolic Panel: Recent Labs  Lab 08/30/18 2126 08/31/18 0549 09/01/18 0317  NA 140 141 139  K 3.7 3.5 4.0  CL 94* 97* 94*  CO2 35* 33* 35*  GLUCOSE 88 123* 100*  BUN 18 19 17   CREATININE 0.99 1.21 1.28*  CALCIUM 8.3* 8.4* 8.1*   GFR: Estimated Creatinine Clearance: 64.4 mL/min (A) (by C-G formula based on SCr of 1.28 mg/dL (H)). Liver Function Tests: Recent Labs  Lab 08/30/18  2126  08/31/18 0549  AST 37 40  ALT 37 39  ALKPHOS 45 47  BILITOT 1.0 1.2  PROT 6.0* 6.0*  ALBUMIN 3.2* 3.2*   No results for input(s): LIPASE, AMYLASE in the last 168 hours. No results for input(s): AMMONIA in the last 168 hours. Coagulation Profile: No results for input(s): INR, PROTIME in the last 168 hours. Cardiac Enzymes: Recent Labs  Lab 08/30/18 2126 08/31/18 0549  TROPONINI 0.06* 0.04*   BNP (last 3 results) No results for input(s): PROBNP in the last 8760 hours. HbA1C: Recent Labs    08/31/18 1005  HGBA1C 6.4*   CBG: No results for input(s): GLUCAP in the last 168 hours. Lipid Profile: No results for input(s): CHOL, HDL, LDLCALC, TRIG, CHOLHDL, LDLDIRECT in the last 72 hours. Thyroid Function Tests: Recent Labs    08/30/18 2126  TSH 6.425*   Anemia Panel: No results for input(s): VITAMINB12, FOLATE, FERRITIN, TIBC, IRON, RETICCTPCT in the last 72 hours. Urine analysis:    Component Value Date/Time   COLORURINE YELLOW 08/31/2018 1114   APPEARANCEUR CLEAR 08/31/2018 1114   LABSPEC 1.015 08/31/2018 1114   PHURINE 9.0 (H) 08/31/2018 1114   GLUCOSEU NEGATIVE 08/31/2018 1114   HGBUR NEGATIVE 08/31/2018 1114   BILIRUBINUR NEGATIVE 08/31/2018 1114   KETONESUR NEGATIVE 08/31/2018 1114   PROTEINUR NEGATIVE 08/31/2018 1114   NITRITE NEGATIVE 08/31/2018 1114   LEUKOCYTESUR NEGATIVE 08/31/2018 1114     Ripudeep Rai M.D. [facility] 09/01/2018, 12:27 PM  Pager: [phone] Between 7am to 7pm - call Pager - [phone]  After 7pm go to [email or web address] - password [email or web address]  Call night coverage person covering after 7pm

## 2018-09-01 NOTE — Progress Notes (Signed)
Patient's blood pressures monitored throughout night. Furosemide held at 2000 hrs 08/31/18 due to pressures ranging from 80s - 130s systolic and 50s - 90s diastolic. Pressures fluctuate between high ranges and low ranges. Will continue to monitor blood pressures for trends and when patient is stable enough for furosemide.

## 2018-09-02 DIAGNOSIS — R7989 Other specified abnormal findings of blood chemistry: Secondary | ICD-10-CM

## 2018-09-02 DIAGNOSIS — E119 Type 2 diabetes mellitus without complications: Secondary | ICD-10-CM

## 2018-09-02 DIAGNOSIS — I700 Atherosclerosis of aorta: Secondary | ICD-10-CM

## 2018-09-02 DIAGNOSIS — F209 Schizophrenia, unspecified: Secondary | ICD-10-CM

## 2018-09-02 DIAGNOSIS — J9601 Acute respiratory failure with hypoxia: Secondary | ICD-10-CM

## 2018-09-02 LAB — GLUCOSE, CAPILLARY
Glucose-Capillary: 111 mg/dL — ABNORMAL HIGH (ref 70–99)
Glucose-Capillary: 116 mg/dL — ABNORMAL HIGH (ref 70–99)

## 2018-09-02 LAB — CBC
HCT: 41.7 % (ref 39.0–52.0)
Hemoglobin: 12.8 g/dL — ABNORMAL LOW (ref 13.0–17.0)
MCH: 30.6 pg (ref 26.0–34.0)
MCHC: 30.7 g/dL (ref 30.0–36.0)
MCV: 99.8 fL (ref 78.0–100.0)
PLATELETS: 220 10*3/uL (ref 150–400)
RBC: 4.18 MIL/uL — ABNORMAL LOW (ref 4.22–5.81)
RDW: 16.2 % — ABNORMAL HIGH (ref 11.5–15.5)
WBC: 13.9 10*3/uL — ABNORMAL HIGH (ref 4.0–10.5)

## 2018-09-02 LAB — BASIC METABOLIC PANEL
Anion gap: 13 (ref 5–15)
BUN: 12 mg/dL (ref 8–23)
CALCIUM: 8.2 mg/dL — AB (ref 8.9–10.3)
CO2: 30 mmol/L (ref 22–32)
CREATININE: 1.17 mg/dL (ref 0.61–1.24)
Chloride: 98 mmol/L (ref 98–111)
GFR calc Af Amer: >60 mL/min (ref >60)
GLUCOSE: 114 mg/dL — AB (ref 70–99)
Potassium: 3.7 mmol/L (ref 3.5–5.1)
Sodium: 141 mmol/L (ref 135–145)

## 2018-09-02 LAB — LIPID PANEL
Cholesterol: 150 mg/dL (ref 0–200)
HDL: 36 mg/dL — AB (ref >40)
LDL CALC: 96 mg/dL (ref 0–99)
Total CHOL/HDL Ratio: 4.2 RATIO
Triglycerides: 88 mg/dL (ref <150)
VLDL: 18 mg/dL (ref 0–40)

## 2018-09-02 MED ORDER — LEVALBUTEROL HCL 0.63 MG/3ML IN NEBU
0.6300 mg | INHALATION_SOLUTION | Freq: Four times a day (QID) | RESPIRATORY_TRACT | Status: DC
  Administered 2018-09-02 – 2018-09-04 (×7): 0.63 mg via RESPIRATORY_TRACT
  Filled 2018-09-02 (×8): qty 3

## 2018-09-02 MED ORDER — FAMOTIDINE 20 MG PO TABS
20.0000 mg | ORAL_TABLET | Freq: Every day | ORAL | Status: DC
  Administered 2018-09-02 – 2018-09-07 (×6): 20 mg via ORAL
  Filled 2018-09-02 (×6): qty 1

## 2018-09-02 MED ORDER — SODIUM CHLORIDE 0.9 % IV SOLN
3.0000 g | Freq: Four times a day (QID) | INTRAVENOUS | Status: DC
  Administered 2018-09-02 – 2018-09-03 (×3): 3 g via INTRAVENOUS
  Filled 2018-09-02 (×5): qty 3

## 2018-09-02 MED ORDER — ALPRAZOLAM 0.5 MG PO TABS: 0.5000 mg | ORAL_TABLET | Freq: Two times a day (BID) | ORAL | Status: DC | PRN

## 2018-09-02 MED ORDER — ESCITALOPRAM OXALATE 10 MG PO TABS
10.0000 mg | ORAL_TABLET | Freq: Every day | ORAL | Status: DC
  Administered 2018-09-02 – 2018-09-07 (×6): 10 mg via ORAL
  Filled 2018-09-02 (×6): qty 1

## 2018-09-02 MED ORDER — POLYETHYLENE GLYCOL 3350 17 G PO PACK
17.0000 g | PACK | Freq: Every day | ORAL | Status: DC
  Administered 2018-09-02 – 2018-09-05 (×2): 17 g via ORAL
  Filled 2018-09-02 (×6): qty 1

## 2018-09-02 MED ORDER — IPRATROPIUM BROMIDE 0.02 % IN SOLN
0.5000 mg | Freq: Four times a day (QID) | RESPIRATORY_TRACT | Status: DC
  Administered 2018-09-02 – 2018-09-04 (×7): 0.5 mg via RESPIRATORY_TRACT
  Filled 2018-09-02 (×8): qty 2.5

## 2018-09-02 NOTE — Clinical Social Work Note (Signed)
Clinical Social Work Assessment  Patient Details  Name: John Jennings MRN: [id number] Date of Birth: March 25, 1946  Date of referral:  09/02/18               Reason for consult:  Discharge Planning                Permission sought to share information with:  Family Supports Permission granted to share information::  Yes, Verbal Permission Granted  Name::     April Shepherd  Agency::  [facility]  Relationship::  daughter  Contact Information:  [phone]  Housing/Transportation Living arrangements for the past 2 months:  [place] of Information:  Adult Children Patient Interpreter Needed:  None Criminal Activity/Legal Involvement Pertinent to Current Situation/Hospitalization:  No - Comment as needed Significant Relationships:  Adult Children, Community Support Lives with:  Facility Resident Do you feel safe going back to the place where you live?    Need for family participation in patient care:  Yes (Comment)  Care giving concerns:  Patient had family at bedside. Patient daughter April did assessment with CSW.   Social Worker assessment / plan:  CSW met patient and daughter at bedside to discuss discharge plan and offer support. Patient did not interact with CSW. April (patients daughter) stated that patient is from [facility] and would like patient to return once medically cleared. Patient has been at facility for 2 years. CSW will reach out to facility to make them aware patient has been admitted into the hospital. CSW will continue to follow for support  Employment status:  Retired Insurance underwriter information:    PT Recommendations:  Not assessed at this time Information / Referral to community resources:  Other (Comment Required)(return back to his facility )  Patient/Family's Response to care:  Family appreciates CSW role in care  Patient/Family's Understanding of and Emotional Response to Diagnosis, Current Treatment, and Prognosis:  Patient  will discharge back to [facility]  Emotional Assessment Appearance:  Appears stated age Attitude/Demeanor/Rapport:  Unable to Assess Affect (typically observed):  Unable to Assess Orientation:  Oriented to Self, Oriented to  Time, Oriented to Situation Alcohol / Substance use:  Not Applicable Psych involvement (Current and /or in the community):  No (Comment)  Discharge Needs  Concerns to be addressed:  No discharge needs identified Readmission within the last 30 days:  No Current discharge risk:  Dependent with Mobility Barriers to Discharge:  Continued Medical Work up   [employer], LCSW 09/02/2018, 2:53 PM
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
## 2018-09-02 NOTE — Progress Notes (Signed)
  Speech Language Pathology Treatment: Dysphagia  Patient Details Name: John Jennings MRN: [id number] DOB: [birth year] Today's Date: 09/02/2018 Time: 4270-6237 SLP Time Calculation (min) (ACUTE ONLY): 20 min  Assessment / Plan / Recommendation Clinical Impression  Requested by RN to re-evaluate patient as interaction/alertness have improved now that daughter is in room. Patient does continue to require time to respond but able to answer questions and participate in po trials. Able to consume solids and liquids with appropriate oral transit time and no overt indication of aspiration. If patient is aspirating, it is likely silent in nature, and other than underlying COPD, patient does not have significant risk factors. Recommend continuing regular diet, thin liquid as long as patient is alert and maintaining O2 levels off of Bipap.   MD, if feel needed to r/o silent aspiration, please order FEES. Otherwise, will f/u briefly for diet tolerance at bedside.    HPI HPI: 81yom PMH schizophrenia, chronic hypoxic respiratory failure on 2 L nasal cannula, former smoker, suspected underlying COPD, atrial fibrillation, admitted to [facility] 9/26 for acute hypoxic resp failure, found to have LVEF 20% (new), afib RVR requiring amiodarone, transferred to [facility] for cardiology eval for cath and psych consult for mental health disorder.  Seen by cardiology, initial conservative management recommended given noncompliance, underlying psychiatric disorder.  Early hospitalization complicated by multiple respiratory events requiring rapid response, fever of unclear etiology, concern for possible aspiration.      SLP Plan  Goals updated;All goals met       Recommendations  Diet recommendations: Regular;Thin liquid Liquids provided via: Cup;Straw Medication Administration: Whole meds with liquid Supervision: Staff to assist with self feeding;Full supervision/cueing for compensatory strategies Compensations: Slow  rate;Small sips/bites Postural Changes and/or Swallow Maneuvers: Seated upright 90 degrees                Oral Care Recommendations: Oral care BID Follow up Recommendations: None SLP Visit Diagnosis: Dysphagia, unspecified (R13.10) Plan: Goals updated;All goals met                   Gabriel Rainwater MA, CCC-SLP     Sid Greener Meryl 09/02/2018, 11:59 AM

## 2018-09-02 NOTE — Progress Notes (Addendum)
Pharmacy Antibiotic Note  John Jennings is a 72 y.o. male admitted on 08/30/2018 with sepsis.   Vancomycin and Zosyn since 9/30 - now to streamline to Unasyn Blood cultures negative to date Currently afebrile, WBC = 13.9 MRSA PCR negative  Scr trending up some  Plan: Vancomycin and Zosyn stopped Unasyn 3 grams iv Q 6 hours Pharmacy to sign off on Unasyn dosing - follow peripherally for renal changes  Height: 5\' 8"  (172.7 cm) Weight: 255 lb (115.7 kg) IBW/kg (Calculated) : 68.4  Temp (24hrs), Avg:100 F (37.8 C), Min:98.5 F (36.9 C), Max:100.8 F (38.2 C)  Recent Labs  Lab 08/30/18 2126 08/31/18 0549 09/01/18 0317 09/02/18 0257  WBC 10.1 13.6* 12.5* 13.9*  CREATININE 0.99 1.21 1.28*  --     Estimated Creatinine Clearance: 64.4 mL/min (A) (by C-G formula based on SCr of 1.28 mg/dL (H)).    No Known Allergies  Antimicrobials this admission:  Zosyn 9/30 >> 10/2 Vancomycin 9/30 >> 10/2 Unasyn 10/2>   Dose adjustments this admission:   Microbiology results:  9/30 BCx x 2: NGTD 9/30 UCx: NG 9/30 MRSA PCR: neg  Thank you Okey Regal, PharmD [phone]   09/02/2018 9:31 AM

## 2018-09-02 NOTE — Care Management Important Message (Signed)
Important Message  Patient Details  Name: John Jennings MRN: [id number] Date of Birth: [birth year]   Medicare Important Message Given:  Yes    Jeziel Hoffmann P Auren Valdes 09/02/2018, 2:52 PM

## 2018-09-02 NOTE — Progress Notes (Signed)
Pt was sleeping, breathing through his mouth and snoring. SPO 75-88% with high flow of NCL 10 LPM. Notifed RT to evaluate, put BIPAP on, SPO2 98%. Temp via axillary 100.2-100.8 F, Tylenol given. Will continue to monitor.  Orvie Caradine,RN

## 2018-09-02 NOTE — Progress Notes (Addendum)
Progress Note  Patient Name: John Jennings Date of Encounter: 09/02/2018  Primary Cardiologist: seen by Dr. Bing Matter [facility] [place] at [facility]  Subjective   Pt fairly flat today. Answers questions with one word responses. Remains on high flow [place]. Denies chest pain.   Inpatient Medications    Scheduled Meds: . apixaban  5 mg Oral BID  . arformoterol  15 mcg Nebulization BID  . aspirin EC  81 mg Oral Daily  . budesonide (PULMICORT) nebulizer solution  0.25 mg Nebulization BID  . divalproex  125 mg Oral Q12H  . divalproex  375 mg Oral QHS  . escitalopram  10 mg Oral Daily  . famotidine  20 mg Oral Daily  . furosemide  40 mg Intravenous Q12H  . levalbuterol  0.63 mg Nebulization QID   And  . ipratropium  0.5 mg Nebulization QID  . nicotine  21 mg Transdermal Daily  . pantoprazole  40 mg Oral Daily  . polyethylene glycol  17 g Oral Daily  . sodium chloride flush  3 mL Intravenous Q12H   Continuous Infusions: . amiodarone 30 mg/hr (09/01/18 2344)  . ampicillin-sulbactam (UNASYN) IV     PRN Meds: acetaminophen, acetaminophen, ALPRAZolam, levalbuterol   Vital Signs    Vitals:   09/02/18 0350 09/02/18 0753 09/02/18 0810 09/02/18 1229  BP: (!) 115/45 90/70 90/70    Pulse: 82 79 86 81  Resp: 16 19 19 15   Temp: 99.9 F (37.7 C) 98.5 F (36.9 C)    TempSrc: Axillary Axillary    SpO2: 97% 97% 100% 96%  Weight:      Height:        Intake/Output Summary (Last 24 hours) at 09/02/2018 1254 Last data filed at 09/02/2018 1115 Gross per 24 hour  Intake 1346.41 ml  Output 6450 ml  Net -5103.59 ml   Filed Weights   08/30/18 1813 08/31/18 0300  Weight: 115.4 kg 115.7 kg    Physical Exam   General: Obese,  NAD Skin: Warm, dry, intact  Head: Normocephalic, atraumatic,clear, moist mucus membranes. Neck: Negative for carotid bruits. No JVD Lungs:Clear to ausculation bilaterally. No wheezes, rales, or rhonchi. Breathing is unlabored. Cardiovascular:  Irregularly irregular with S1 S2. No murmurs, rubs, gallops, or LV heave appreciated. Abdomen: Soft, non-tender, non-distended with normoactive bowel sounds. No obvious abdominal masses. MSK: Strength and tone appear normal for age. 5/5 in all extremities Extremities: No edema. No clubbing or cyanosis. DP/PT pulses 2+ bilaterally Neuro: Alert and oriented to person and place. No focal deficits. No facial asymmetry. MAE spontaneously. Psych: Responds to questions appropriately with flat affect.    Labs    Chemistry Recent Labs  Lab 08/30/18 2126 08/31/18 0549 09/01/18 0317 09/02/18 0257  NA 140 141 139 141  K 3.7 3.5 4.0 3.7  CL 94* 97* 94* 98  CO2 35* 33* 35* 30  GLUCOSE 88 123* 100* 114*  BUN 18 19 17 12   CREATININE 0.99 1.21 1.28* 1.17  CALCIUM 8.3* 8.4* 8.1* 8.2*  PROT 6.0* 6.0*  --   --   ALBUMIN 3.2* 3.2*  --   --   AST 37 40  --   --   ALT 37 39  --   --   ALKPHOS 45 47  --   --   BILITOT 1.0 1.2  --   --   GFRNONAA >60 58* 54* >60  GFRAA >60 >60 >60 >60  ANIONGAP 11 11 10 13      Hematology  Recent Labs  Lab 08/31/18 0549 09/01/18 0317 09/02/18 0257  WBC 13.6* 12.5* 13.9*  RBC 4.14* 4.03* 4.18*  HGB 12.9* 12.3* 12.8*  HCT 41.5 40.3 41.7  MCV 100.2* 100.0 99.8  MCH 31.2 30.5 30.6  MCHC 31.1 30.5 30.7  RDW 16.7* 16.4* 16.2*  PLT 265 232 220    Cardiac Enzymes Recent Labs  Lab 08/30/18 2126 08/31/18 0549  TROPONINI 0.06* 0.04*   No results for input(s): TROPIPOC in the last 168 hours.   BNP Recent Labs  Lab 08/30/18 2126 08/31/18 0549  BNP 1,293.5* 1,635.9*     DDimer No results for input(s): DDIMER in the last 168 hours.   Radiology    No results found.  Telemetry    09/02/18 Atrial fibrillation HR 70's-80's  - Personally Reviewed  ECG    No new tracing as of 09/02/18 - Personally Reviewed  Cardiac Studies   Echocardiogram 08/31/18: Study Conclusions  - Left ventricle: The cavity size was severely dilated. Wall   thickness  was normal. Systolic function was severely reduced. The   estimated ejection fraction was in the range of 20% to 25%. - Aortic valve: AV is thickened, calcified with restricted motioni,   probably mild to moderate.Reinaldo Raddle atrial fibrillatoin and severe   LV dysfunction difficult to relay on gradients to determine   severity. - Right ventricle: The cavity size was mildly dilated. Systolic   function was moderately reduced. - Pericardium, extracardiac: A trivial pericardial effusion was   identified.  Patient Profile     72 y.o. male with a hx of schizophrenia, chronic hypoxic respiratory failure on baseline home O2, DM 2, BPH, dementia, depression and COPDwho is being seen for the evaluation of afib with RVR and acute CHFat the request of Internal Medicine.  Assessment & Plan    1. Acute on chronic systolic CHF: -Pt required Bipap ventilation overnight on 08/31/18 with improvement>>currenlty on 10L high flow O2 supplementation -Last echocardiogram on with LVEF of EF was 25% with possible wall motion abnormality during admission at [facility],  -Previous LVEF 50% in April 2019 -Not currently candidate for heart cath given hx of medication noncompliance secondary to mental illness -Will optimized heart failure therapies, being cautious of renal function and hypotension which are limiting at this time  -Last CXR with evidence of fluid volume overload with right pleural effusion and BNP >1000 -Weight, 255lb on 08/31/18 -I&O, net negative 8.4L since admission  -Continue Lasix 40mg  IV twice daily, ASA  -Consider adding ACE-I/ARB if BP and renal function can tolerate -Will obtain lipid profile and place on statin if appropriate    2. Atrial fibrillation with RVR and elevated troponin in the setting of demand ischemia: -Currently controlled with IV Amiodarone 30mg /hr>>will need transition to PO>>remians in AF  -Continue Eliquis 5 mg twice daily   3. Acute on chronic hypoxic  respiratory failure in the setting of acute CHF: -More stable today, on high flow [place] with adequate saturations -Possible underlying pulmonary fibrosis with hx of tobacco use  -On nebs and inhalers per primary team   4. Hypotension: -Low, 90/70>115/45>100/51>114/53 -Limiting medication therapies for heart failure  5. Schizophrenia: -Managed per primary team   6. DM2: -Last Hb A1c, 6.4 -Placed on SSI for glucose control while inpatient status   Signed, Georgie Chard NP-C [facility] Pager: [phone] 09/02/2018, 12:54 PM     For questions or updates, please contact   Please consult [email or web address] for contact info under Cardiology/STEMI. ------------------------------------------------------------------------------------------   History and  all data above reviewed.  Patient examined.  I agree with the findings as above.  John Jennings is resting comfortably. I primarily discussed his care with his daughter April.  GEN:No acute distress, resting and breathing comfortably on high flow oxygen.   Cardiac: Irregular rhythm, normal rate, no appreciable murmurs Respiratory:Clear to auscultation bilaterally. ZO:XWRU, nontender, non-distended  MS:No edema; Right hand post traumatic deformity. Neuro:Nonfocal  Psych: Flat affect   All available labs, radiology testing, previous records reviewed. Agree with documented assessment and plan of my colleague as stated above with the following additions or changes:  Principal Problem:   Acute on chronic respiratory failure with hypoxia (HCC) Active Problems:   Acute systolic CHF (congestive heart failure) (HCC)   COPD with acute exacerbation (HCC)   Fever   Schizophrenia (HCC)   Paroxysmal atrial fibrillation (HCC)   Aortic atherosclerosis (HCC)   Plan:  John Jennings seems to be improving from the standpoint of both respiratory status as well as rate control and atrial fibrillation.  While his decreased systolic function may be  due to acute medical illness including fever and concern for sepsis, it may also be reasonable to consider a diagnostic coronary assessment to ensure that severe coronary artery disease is not the source of his drop in ejection fraction.  I will review this with my colleagues in interventional cardiology and determine if a diagnostic angiogram may be reasonable.  While we could consider CT coronary angiography, the patient's atrial fibrillation will limit the diagnostic quality of the study. His troponin was not elevated nor did it have a delta, so ACS as a source of acute issue is less likely.  Broad-spectrum antibiotics have been de-escalated.  I will plan to transition from amiodarone infusion to oral amiodarone to complete a 5 g load we can begin this tomorrow.  I would recommend amiodarone 400 mg twice daily for approximately an additional 7 doses.  Meds: - dc amio infusion, will begin amiodarone 400mg  daily - apixiaban 5 mg daily for afib AC - ASA 81 mg daily - furosemide 40 mg BID (-10 liters since admision). Will change to 80 mg po daily, will likely decrease dose prior to hospital discharge. - Would like to start metoprolol and lisinopril for heart failure, but hypotension has been difficult to control.   Cardiology will follow along.  Parke Poisson, MD [facility] 5:51 PM  09/02/2018

## 2018-09-03 LAB — BASIC METABOLIC PANEL
Anion gap: 10 (ref 5–15)
BUN: 11 mg/dL (ref 8–23)
CHLORIDE: 97 mmol/L — AB (ref 98–111)
CO2: 33 mmol/L — AB (ref 22–32)
CREATININE: 1.14 mg/dL (ref 0.61–1.24)
Calcium: 8.1 mg/dL — ABNORMAL LOW (ref 8.9–10.3)
GFR calc Af Amer: >60 mL/min (ref >60)
GFR calc non Af Amer: >60 mL/min (ref >60)
Glucose, Bld: 102 mg/dL — ABNORMAL HIGH (ref 70–99)
Potassium: 3.5 mmol/L (ref 3.5–5.1)
SODIUM: 140 mmol/L (ref 135–145)

## 2018-09-03 LAB — CBC
HCT: 40.1 % (ref 39.0–52.0)
Hemoglobin: 12.1 g/dL — ABNORMAL LOW (ref 13.0–17.0)
MCH: 30.3 pg (ref 26.0–34.0)
MCHC: 30.2 g/dL (ref 30.0–36.0)
MCV: 100.3 fL — AB (ref 78.0–100.0)
Platelets: 220 10*3/uL (ref 150–400)
RBC: 4 MIL/uL — ABNORMAL LOW (ref 4.22–5.81)
RDW: 16 % — AB (ref 11.5–15.5)
WBC: 11.3 10*3/uL — AB (ref 4.0–10.5)

## 2018-09-03 LAB — GLUCOSE, CAPILLARY
Glucose-Capillary: 112 mg/dL — ABNORMAL HIGH (ref 70–99)
Glucose-Capillary: 148 mg/dL — ABNORMAL HIGH (ref 70–99)
Glucose-Capillary: 115 mg/dL — ABNORMAL HIGH (ref 70–99)

## 2018-09-03 LAB — T4, FREE: Free T4: 1.03 ng/dL (ref 0.82–1.77)

## 2018-09-03 MED ORDER — AMIODARONE HCL 200 MG PO TABS: 400.0000 mg | ORAL_TABLET | Freq: Two times a day (BID) | ORAL | Status: DC

## 2018-09-03 MED ORDER — AMIODARONE HCL 200 MG PO TABS
400.0000 mg | ORAL_TABLET | Freq: Two times a day (BID) | ORAL | Status: AC
  Administered 2018-09-03 – 2018-09-06 (×7): 400 mg via ORAL
  Filled 2018-09-03 (×7): qty 2

## 2018-09-03 MED ORDER — CHLORHEXIDINE GLUCONATE 0.12 % MT SOLN
15.0000 mL | Freq: Two times a day (BID) | OROMUCOSAL | Status: DC
  Administered 2018-09-03 – 2018-09-07 (×9): 15 mL via OROMUCOSAL
  Filled 2018-09-03 (×8): qty 15

## 2018-09-03 MED ORDER — AMIODARONE HCL 200 MG PO TABS
200.0000 mg | ORAL_TABLET | Freq: Every day | ORAL | Status: DC
  Administered 2018-09-07: 200 mg via ORAL
  Filled 2018-09-03: qty 1

## 2018-09-03 MED ORDER — ORAL CARE MOUTH RINSE
15.0000 mL | Freq: Two times a day (BID) | OROMUCOSAL | Status: DC
  Administered 2018-09-05 – 2018-09-06 (×3): 15 mL via OROMUCOSAL

## 2018-09-03 MED ORDER — AMOXICILLIN-POT CLAVULANATE 875-125 MG PO TABS
1.0000 | ORAL_TABLET | Freq: Two times a day (BID) | ORAL | Status: DC
  Administered 2018-09-03 – 2018-09-04 (×4): 1 via ORAL
  Filled 2018-09-03 (×4): qty 1

## 2018-09-03 MED ORDER — ARFORMOTEROL TARTRATE 15 MCG/2ML IN NEBU
15.0000 ug | INHALATION_SOLUTION | Freq: Two times a day (BID) | RESPIRATORY_TRACT | Status: DC
  Administered 2018-09-03 – 2018-09-07 (×9): 15 ug via RESPIRATORY_TRACT
  Filled 2018-09-03 (×8): qty 2

## 2018-09-03 NOTE — Progress Notes (Signed)
Foley catheter removed at this time.

## 2018-09-03 NOTE — Progress Notes (Signed)
Pt placed on BIPAP for night time rest.  Tolerating well no issues to report.  RT will continue to monitor.

## 2018-09-03 NOTE — Progress Notes (Signed)
Clinical Social Worker following patient and family for support and discharge needs. CSW spoke with Lorelle Formosa from [facility] in regards to patients discharge plans. Lorelle Formosa stated patient is able to return as long he is medically stable. Lorelle Formosa stated facility is able to take weekend admissions.    Marrianne Mood, MSW,  [employer] [phone]

## 2018-09-03 NOTE — Progress Notes (Signed)
  Speech Language Pathology Treatment: Dysphagia  Patient Details Name: John Jennings MRN: [id number] DOB: Jan 23, 1946 Today's Date: 09/03/2018 Time: 0600-4599 SLP Time Calculation (min) (ACUTE ONLY): 21 min  Assessment / Plan / Recommendation Clinical Impression  Followed up from yesterday's clinical swallow assessment. Pt stated he could not feed himself however RN reported after unwrapping muffin for him he was able to self feed. When handed fork he stabbed pears with hand to mouth without difficulty but needs cues to initiate due to suspected cognitive deficits. Affect flat and responses ambiguous. Delayed cough x 2 that did not appear in relation to food/liquid. No difficulty masticating rather tough chicken on salad. Recommend continue regular texture and thin liquids, pill whole in applesauce. No further ST needed.    HPI HPI: 22yom PMH schizophrenia, chronic hypoxic respiratory failure on 2 L nasal cannula, former smoker, suspected underlying COPD, atrial fibrillation, admitted to [facility] 9/26 for acute hypoxic resp failure, found to have LVEF 20% (new), afib RVR requiring amiodarone, transferred to [facility] for cardiology eval for cath and psych consult for mental health disorder.  Seen by cardiology, initial conservative management recommended given noncompliance, underlying psychiatric disorder.  Early hospitalization complicated by multiple respiratory events requiring rapid response, fever of unclear etiology, concern for possible aspiration.      SLP Plan  All goals met;Discharge SLP treatment due to (comment)       Recommendations  Diet recommendations: Regular;Thin liquid Liquids provided via: Cup;Straw Medication Administration: Whole meds with liquid Supervision: Staff to assist with self feeding;Full supervision/cueing for compensatory strategies Compensations: Slow rate;Small sips/bites Postural Changes and/or Swallow Maneuvers: Seated upright 90 degrees                Oral Care Recommendations: Oral care BID Follow up Recommendations: None SLP Visit Diagnosis: Dysphagia, unspecified (R13.10) Plan: All goals met;Discharge SLP treatment due to (comment)       GO                Houston Siren 09/03/2018, 2:22 PM Orbie Pyo Colvin Caroli.Ed Risk analyst [phone] Office [phone]

## 2018-09-03 NOTE — Progress Notes (Addendum)
Progress Note  Patient Name: John Jennings Date of Encounter: 09/03/2018  Primary Cardiologist: Gypsy Balsam, MD -New, seen at [facility]  Subjective   Patient has a flat affect and does not say more than a couple of words when answering questions.  He denies shortness of breath.  Says he has intermittent chest heaviness that lasts for few seconds at a time.  He says that he has quit smoking about 6 months ago and does not understand why they keep trying to put a nicotine patch on him.  Inpatient Medications    Scheduled Meds: . amiodarone  400 mg Oral BID   Followed by  . [START ON 09/07/2018] amiodarone  200 mg Oral Daily  . amoxicillin-clavulanate  1 tablet Oral Q12H  . apixaban  5 mg Oral BID  . arformoterol  15 mcg Nebulization BID  . aspirin EC  81 mg Oral Daily  . budesonide (PULMICORT) nebulizer solution  0.25 mg Nebulization BID  . chlorhexidine  15 mL Mouth Rinse BID  . divalproex  125 mg Oral Q12H  . divalproex  375 mg Oral QHS  . escitalopram  10 mg Oral Daily  . famotidine  20 mg Oral Daily  . furosemide  40 mg Intravenous Q12H  . levalbuterol  0.63 mg Nebulization QID   And  . ipratropium  0.5 mg Nebulization QID  . mouth rinse  15 mL Mouth Rinse q12n4p  . nicotine  21 mg Transdermal Daily  . pantoprazole  40 mg Oral Daily  . polyethylene glycol  17 g Oral Daily  . sodium chloride flush  3 mL Intravenous Q12H   Continuous Infusions:  PRN Meds: acetaminophen, acetaminophen, ALPRAZolam, levalbuterol   Vital Signs    Vitals:   09/03/18 1100 09/03/18 1200 09/03/18 1241 09/03/18 1243  BP:   (!) 94/42 (!) 115/50  Pulse:   85   Resp: (!) 21 17 11 18   Temp:      TempSrc:      SpO2: 100% 96% 94% (!) 87%  Weight:      Height:        Intake/Output Summary (Last 24 hours) at 09/03/2018 1433 Last data filed at 09/03/2018 1130 Gross per 24 hour  Intake 2079.15 ml  Output 2900 ml  Net -820.85 ml   Filed Weights   08/30/18 1813 08/31/18 0300    Weight: 115.4 kg 115.7 kg    Telemetry    A. fib in the 70s-80s with occasional P waves- Personally Reviewed  ECG    No new tracings- Personally Reviewed  Physical Exam   GEN: No acute distress.   Neck: No JVD Cardiac:  Irregularly irregular rhythm, no murmurs, rubs, or gallops.  Respiratory: Clear to auscultation bilaterally. GI: Soft, nontender, central obesity MS: No edema; left hand contracted Neuro:  Nonfocal  Psych: Flat affect  Labs    Chemistry Recent Labs  Lab 08/30/18 2126 08/31/18 0549 09/01/18 0317 09/02/18 0257 09/03/18 0338  NA 140 141 139 141 140  K 3.7 3.5 4.0 3.7 3.5  CL 94* 97* 94* 98 97*  CO2 35* 33* 35* 30 33*  GLUCOSE 88 123* 100* 114* 102*  BUN 18 19 17 12 11   CREATININE 0.99 1.21 1.28* 1.17 1.14  CALCIUM 8.3* 8.4* 8.1* 8.2* 8.1*  PROT 6.0* 6.0*  --   --   --   ALBUMIN 3.2* 3.2*  --   --   --   AST 37 40  --   --   --  ALT 37 39  --   --   --   ALKPHOS 45 47  --   --   --   BILITOT 1.0 1.2  --   --   --   GFRNONAA >60 58* 54* >60 >60  GFRAA >60 >60 >60 >60 >60  ANIONGAP 11 11 10 13 10      Hematology Recent Labs  Lab 09/01/18 0317 09/02/18 0257 09/03/18 0338  WBC 12.5* 13.9* 11.3*  RBC 4.03* 4.18* 4.00*  HGB 12.3* 12.8* 12.1*  HCT 40.3 41.7 40.1  MCV 100.0 99.8 100.3*  MCH 30.5 30.6 30.3  MCHC 30.5 30.7 30.2  RDW 16.4* 16.2* 16.0*  PLT 232 220 220    Cardiac Enzymes Recent Labs  Lab 08/30/18 2126 08/31/18 0549  TROPONINI 0.06* 0.04*   No results for input(s): TROPIPOC in the last 168 hours.   BNP Recent Labs  Lab 08/30/18 2126 08/31/18 0549  BNP 1,293.5* 1,635.9*     DDimer No results for input(s): DDIMER in the last 168 hours.   Radiology    No results found.  Cardiac Studies   Echocardiogram 08/31/18: Study Conclusions - Left ventricle: The cavity size was severely dilated. Wall thickness was normal. Systolic function was severely reduced. The estimated ejection fraction was in the range of  20% to 25%. - Aortic valve: AV is thickened, calcified with restricted motioni, probably mild to moderate.Reinaldo Raddle atrial fibrillatoin and severe LV dysfunction difficult to relay on gradients to determine severity. - Right ventricle: The cavity size was mildly dilated. Systolic function was moderately reduced. - Pericardium, extracardiac: A trivial pericardial effusion was identified.  Patient Profile     72 y.o. male with a hx of schizophrenia, chronic hypoxic respiratory failure on baseline home O2, DM 2, BPH, dementia, depression and COPDwho is being seen for the evaluation of afib with RVR and acute CHF  Assessment & Plan    Acute on chronic systolic CHF -EF 01-02% with possible wall motion abnormality during admission at [facility], decreased from prior echo in 03/21/2018 with EF 50% -The patient has a history of schizophrenia and medication noncompliance making heart catheterization not a good option.  Could consider a diagnostic cath to evaluate for ischemic cause of his reduced EF without plans for PCI with need for antiplatelet therapy.  When asked about his history of medication compliance/noncompliance, he does not have much to say stating that he does not know much about his medications.  He seems to have poor understanding of his more complex health issues.  He may be more a candidate for conservative management. -Diuresing with furosemide 40 mg IV twice daily.  3.5 L urine output yesterday.  -9.8 L fluid balance since admission. -There are no weights recorded since 9/30.  I will order daily weight and strict intake and output -Renal function has remained stable with serum creatinine 1.14 today. -Would like to optimize medical therapy for heart failure with beta-blocker and ACE inhibitor, however, blood pressure is not sufficient to allow addition of these medicines at this time.  Atrial fibrillation -Amiodarone transitioned from IV to oral dosing with 400 mg  daily -On a apixaban 5 mg twice daily for stroke risk reduction with CHA2DS2-VASc 3 (CHF, age DM)   -Telemetry shows continued A. fib with occasional P waves.  Rate is well controlled.  Hope to see his rhythm convert to sinus.  Acute on chronic hypoxic respiratory failure in the setting of acute CHF: -Continues to slowly improve, on high flow  [place] with adequate saturations during the day and BiPAP at night -Possible underlying pulmonary fibrosis with hx of tobacco use.  On empiric antibiotics for fever/possible sepsis per primary team.  There was some concern for possible aspiration. -On nebs and inhalers per primary team   Tobacco abuse -Nicotine patch is ordered but has been refused.  He states that he quit smoking 6 months ago and does not need nicotine patches.  Schizophrenia -Patient is on injectable medication as an outpatient.  Depakote has been added during this admission -Managed per primary team  Diabetes type 2 -Last Hb A1c, 6.4 -Placed on SSI for glucose control while inpatient status      For questions or updates, please contact [facility] Please consult [email or web address] for contact info under        Signed, Berton Bon, NP  09/03/2018, 2:33 PM   ------------------------------------------------------------------------------   History and all data above reviewed.  Patient examined.  I agree with the findings as above.  John Jennings appears in brighter spirits today. No acute concerns. Breathing more comfortably.  GEN:No acute distress,resting and breathing comfortably on high flow oxygen.  Cardiac:Irregular rhythm, normal rate, no appreciable murmurs Respiratory:Clear to auscultation bilaterally. ZO:XWRU, nontender, non-distended  MS:No edema; Right hand post traumatic deformity. Neuro:Nonfocal  Psych: brighter affect, however he is asking for someone who is not present in the room.  All available labs, radiology testing, previous records  reviewed. Agree with documented assessment and plan of my colleague as stated above with the following additions or changes:  Principal Problem:   Acute on chronic respiratory failure with hypoxia (HCC) Active Problems:   Acute systolic CHF (congestive heart failure) (HCC)   COPD with acute exacerbation (HCC)   Fever   Schizophrenia (HCC)   Paroxysmal atrial fibrillation (HCC)   Aortic atherosclerosis (HCC)    Plan: Doing better today, overnight BiPAP appears to have a very positive effect on the patient. Continue oral amiodarone load. Still not an ideal cath candidate, but will reassess daily for potential for diagnostic angiogram.  Parke Poisson, MD [facility] 5:08 PM  09/03/2018
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
## 2018-09-04 LAB — BASIC METABOLIC PANEL
Anion gap: 12 (ref 5–15)
BUN: 11 mg/dL (ref 8–23)
CHLORIDE: 95 mmol/L — AB (ref 98–111)
CO2: 33 mmol/L — ABNORMAL HIGH (ref 22–32)
CREATININE: 1.01 mg/dL (ref 0.61–1.24)
Calcium: 8.1 mg/dL — ABNORMAL LOW (ref 8.9–10.3)
GFR calc Af Amer: >60 mL/min (ref >60)
GFR calc non Af Amer: >60 mL/min (ref >60)
Glucose, Bld: 92 mg/dL (ref 70–99)
Potassium: 3.4 mmol/L — ABNORMAL LOW (ref 3.5–5.1)
Sodium: 140 mmol/L (ref 135–145)

## 2018-09-04 LAB — GLUCOSE, CAPILLARY
GLUCOSE-CAPILLARY: 84 mg/dL (ref 70–99)
GLUCOSE-CAPILLARY: 98 mg/dL (ref 70–99)
Glucose-Capillary: 171 mg/dL — ABNORMAL HIGH (ref 70–99)

## 2018-09-04 MED ORDER — CARVEDILOL 3.125 MG PO TABS
3.1250 mg | ORAL_TABLET | Freq: Two times a day (BID) | ORAL | Status: DC
  Administered 2018-09-05 – 2018-09-06 (×4): 3.125 mg via ORAL
  Filled 2018-09-04 (×4): qty 1

## 2018-09-04 MED ORDER — FUROSEMIDE 40 MG PO TABS
40.0000 mg | ORAL_TABLET | Freq: Two times a day (BID) | ORAL | Status: DC
  Administered 2018-09-05 – 2018-09-07 (×5): 40 mg via ORAL
  Filled 2018-09-04 (×4): qty 1
  Filled 2018-09-04: qty 2

## 2018-09-04 MED ORDER — LEVALBUTEROL HCL 0.63 MG/3ML IN NEBU: 0.6300 mg | INHALATION_SOLUTION | Freq: Three times a day (TID) | RESPIRATORY_TRACT | Status: DC

## 2018-09-04 MED ORDER — LEVALBUTEROL HCL 0.63 MG/3ML IN NEBU
0.6300 mg | INHALATION_SOLUTION | Freq: Three times a day (TID) | RESPIRATORY_TRACT | Status: DC
  Administered 2018-09-04 – 2018-09-07 (×10): 0.63 mg via RESPIRATORY_TRACT
  Filled 2018-09-04 (×10): qty 3

## 2018-09-04 MED ORDER — IPRATROPIUM BROMIDE 0.02 % IN SOLN
0.5000 mg | Freq: Three times a day (TID) | RESPIRATORY_TRACT | Status: DC
  Administered 2018-09-04 – 2018-09-07 (×10): 0.5 mg via RESPIRATORY_TRACT
  Filled 2018-09-04 (×10): qty 2.5

## 2018-09-04 MED ORDER — POTASSIUM CHLORIDE CRYS ER 20 MEQ PO TBCR
60.0000 meq | EXTENDED_RELEASE_TABLET | Freq: Once | ORAL | Status: AC
  Administered 2018-09-04: 60 meq via ORAL
  Filled 2018-09-04: qty 3

## 2018-09-04 MED ORDER — IPRATROPIUM BROMIDE 0.02 % IN SOLN: 0.5000 mg | Freq: Four times a day (QID) | RESPIRATORY_TRACT | Status: DC
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
## 2018-09-04 NOTE — Evaluation (Signed)
Physical Therapy Evaluation Patient Details Name: John Jennings DOBOSZ MRN: [id number] DOB: 09/22/1946 Today's Date: 09/04/2018   History of Present Illness  HPI: 71yom PMH schizophrenia, chronic hypoxic respiratory failure on 2 L nasal cannula, former smoker, suspected underlying COPD, atrial fibrillation, admitted to [facility] 9/26 for acute hypoxic resp failure, found to have LVEF 20% (new), afib RVR requiring amiodarone, transferred to [facility] for cardiology eval for cath and psych consult for mental health disorder.  Seen by cardiology, initial conservative management recommended given noncompliance, underlying psychiatric disorder.  Early hospitalization complicated by multiple respiratory events requiring rapid response, fever of unclear etiology, concern for possible aspiration.  Clinical Impression   Pt admitted with above diagnosis. Pt currently with functional limitations due to the deficits listed below (see PT Problem List). Mr. Taul is not the most reliable historian, so his PLOF is unclear to me; At one point, he mentioned propelling himself to the dining hall at his ALF in his wheelchair, and indicating that it "isn't hard" to transfer to his wheelchair; He presents to PT with decr functional mobility, and dubious participation in therapy;  Pt will benefit from skilled PT to increase their independence and safety with mobility to allow discharge to the venue listed below.       Follow Up Recommendations SNF    Equipment Recommendations  Other (comment)(to be determined)    Recommendations for Other Services OT consult(ordered per protocol)     Precautions / Restrictions Precautions Precautions: Fall      Mobility  Bed Mobility Overal bed mobility: Needs Assistance Bed Mobility: Rolling;Supine to Sit;Sit to Supine Rolling: Mod assist   Supine to sit: Total assist Sit to supine: Min guard   General bed mobility comments: Cues and mod assist to roll R and L for better pad  placement; Total assist to get to EOB sitting; minguard for sit to supine  Transfers                    Ambulation/Gait                Stairs            Wheelchair Mobility    Modified Rankin (Stroke Patients Only)       Balance Overall balance assessment: Needs assistance Sitting-balance support: Feet supported;Single extremity supported Sitting balance-Leahy Scale: Fair                                       Pertinent Vitals/Pain Pain Assessment: Faces Faces Pain Scale: Hurts little more Pain Location: Unspecified Pain Descriptors / Indicators: Grimacing Pain Intervention(s): Monitored during session    Home Living Family/patient expects to be discharged to:: Assisted living                 Additional Comments: Obtained information from pt, who is not a reliable historian; at one point he told me he uses a RW to walk to the dining hall, then at another point he indicated he propels himself in a wheelchair to the dining hall; he indicated that it is usually "not hard" to transfer to the wheelchair; All of this information will need to be verified    Prior Function Level of Independence: Needs assistance   Gait / Transfers Assistance Needed: Unclear, but it is likely he uses a wheelchair to get to dining hall  ADL's / Homemaking Assistance Needed: Tells me he  doesn't have much help with ADLs, and that it is "hard" to bathe  Comments: Will need to verify PLOF information and how much assist is available to Mr. Witham at ALF     Hand Dominance        Extremity/Trunk Assessment   Upper Extremity Assessment Upper Extremity Assessment: Defer to OT evaluation;LUE deficits/detail LUE Deficits / Details: Noted decr voluntary movement LUE; also hand in MCP extension and IP flexion    Lower Extremity Assessment Lower Extremity Assessment: Generalized weakness       Communication   Communication: No difficulties;Other  (comment)(though slow to answer questions)  Cognition Arousal/Alertness: Awake/alert Behavior During Therapy: WFL for tasks assessed/performed;Flat affect Overall Cognitive Status: No family/caregiver present to determine baseline cognitive functioning                                 General Comments: Decr initiation      General Comments General comments (skin integrity, edema, etc.): Sat EOB approx 5 minutes; O2 sats 92% on 5 L; BP 123/67; HR rnge 90-104 bpm    Exercises     Assessment/Plan    PT Assessment Patient needs continued PT services  PT Problem List Decreased strength;Decreased range of motion;Decreased activity tolerance;Decreased balance;Decreased mobility;Decreased coordination;Decreased cognition;Decreased knowledge of use of DME;Decreased safety awareness;Decreased knowledge of precautions;Cardiopulmonary status limiting activity       PT Treatment Interventions DME instruction;Gait training;Functional mobility training;Therapeutic activities;Therapeutic exercise;Balance training;Neuromuscular re-education;Cognitive remediation;Patient/family education;Wheelchair mobility training    PT Goals (Current goals can be found in the Care Plan section)  Acute Rehab PT Goals Patient Stated Goal: Did not state PT Goal Formulation: Patient unable to participate in goal setting Time For Goal Achievement: 09/18/18 Potential to Achieve Goals: Fair    Frequency Min 2X/week   Barriers to discharge   Need to know level of assist available to Mr. Alcorn at ALF    Co-evaluation               AM-PAC PT "6 Clicks" Daily Activity  Outcome Measure Difficulty turning over in bed (including adjusting bedclothes, sheets and blankets)?: Unable Difficulty moving from lying on back to sitting on the side of the bed? : Unable Difficulty sitting down on and standing up from a chair with arms (e.g., wheelchair, bedside commode, etc,.)?: Unable Help needed moving to  and from a bed to chair (including a wheelchair)?: Total Help needed walking in hospital room?: Total Help needed climbing 3-5 steps with a railing? : Total 6 Click Score: 6    End of Session Equipment Utilized During Treatment: Oxygen Activity Tolerance: Patient tolerated treatment well Patient left: in bed;with call bell/phone within reach;with bed alarm set;Other (comment)(bed in semi-chair position) Nurse Communication: Mobility status;Need for lift equipment PT Visit Diagnosis: Unsteadiness on feet (R26.81);Other abnormalities of gait and mobility (R26.89);Muscle weakness (generalized) (M62.81)    Time: 1540-1601 PT Time Calculation (min) (ACUTE ONLY): 21 min   Charges:   PT Evaluation $PT Eval Moderate Complexity: 1 Mod          Van Clines, [place]  Acute Rehabilitation Services Pager [phone] Office [phone]   Levi Aland 09/04/2018, 4:50 PM

## 2018-09-04 NOTE — Progress Notes (Signed)
Steward Drone, Director at [facility] called to get update on patient. She wanted to know if there were any plans for discharge, specifically over the weekend if his medical condition had not improved. I advised that there was no indication at this time for discharge.   Ernestina Columbia, RN

## 2018-09-04 NOTE — Progress Notes (Addendum)
Progress Note  Patient Name: John Jennings Date of Encounter: 09/04/2018  Primary Cardiologist: Gypsy Balsam, MD   Subjective   Patient denies chest pain.  He is having some vague abdominal pain.  His oxygen has been weaned down to 5 L and maintaining sats in the 90s.  He has not gotten up out of the bed yet. The nurse says that PT is working with the patient, but I do not see a note.   Inpatient Medications    Scheduled Meds: . amiodarone  400 mg Oral BID   Followed by  . [START ON 09/07/2018] amiodarone  200 mg Oral Daily  . amoxicillin-clavulanate  1 tablet Oral Q12H  . apixaban  5 mg Oral BID  . arformoterol  15 mcg Nebulization BID  . aspirin EC  81 mg Oral Daily  . budesonide (PULMICORT) nebulizer solution  0.25 mg Nebulization BID  . chlorhexidine  15 mL Mouth Rinse BID  . divalproex  125 mg Oral Q12H  . divalproex  375 mg Oral QHS  . escitalopram  10 mg Oral Daily  . famotidine  20 mg Oral Daily  . furosemide  40 mg Intravenous Q12H  . ipratropium  0.5 mg Nebulization TID   And  . levalbuterol  0.63 mg Nebulization TID  . mouth rinse  15 mL Mouth Rinse q12n4p  . nicotine  21 mg Transdermal Daily  . pantoprazole  40 mg Oral Daily  . polyethylene glycol  17 g Oral Daily  . sodium chloride flush  3 mL Intravenous Q12H   Continuous Infusions:  PRN Meds: acetaminophen, acetaminophen, ALPRAZolam, levalbuterol   Vital Signs    Vitals:   09/04/18 1100 09/04/18 1200 09/04/18 1300 09/04/18 1335  BP:      Pulse:      Resp: 18 (!) 21 18 (!) 22  Temp:    98.6 F (37 C)  TempSrc:    Oral  SpO2: 97% 96% 95% 94%  Weight:      Height:        Intake/Output Summary (Last 24 hours) at 09/04/2018 1359 Last data filed at 09/04/2018 0800 Gross per 24 hour  Intake 914.88 ml  Output 1875 ml  Net -960.12 ml   Filed Weights   08/30/18 1813 08/31/18 0300 09/04/18 0434  Weight: 115.4 kg 115.7 kg 108.6 kg    Telemetry    Atrial fibrillation in the 80s with  occasional P waves- Personally Reviewed  ECG    No new tracings- Personally Reviewed  Physical Exam   GEN:  Obese male, no acute distress.   Neck: No JVD Cardiac:  Irregularly irregular rhythm, no murmurs, rubs, or gallops.  Respiratory: Clear to auscultation bilaterally. GI: Soft, nontender, non-distended  MS: No edema; No deformity. Neuro:   Appears to have slow processing Psych: Flat affect   Labs    Chemistry Recent Labs  Lab 08/30/18 2126 08/31/18 0549  09/02/18 0257 09/03/18 0338 09/04/18 0347  NA 140 141   < > 141 140 140  K 3.7 3.5   < > 3.7 3.5 3.4*  CL 94* 97*   < > 98 97* 95*  CO2 35* 33*   < > 30 33* 33*  GLUCOSE 88 123*   < > 114* 102* 92  BUN 18 19   < > 12 11 11   CREATININE 0.99 1.21   < > 1.17 1.14 1.01  CALCIUM 8.3* 8.4*   < > 8.2* 8.1* 8.1*  PROT 6.0* 6.0*  --   --   --   --  ALBUMIN 3.2* 3.2*  --   --   --   --   AST 37 40  --   --   --   --   ALT 37 39  --   --   --   --   ALKPHOS 45 47  --   --   --   --   BILITOT 1.0 1.2  --   --   --   --   GFRNONAA >60 58*   < > >60 >60 >60  GFRAA >60 >60   < > >60 >60 >60  ANIONGAP 11 11   < > 13 10 12    < > = values in this interval not displayed.     Hematology Recent Labs  Lab 09/01/18 0317 09/02/18 0257 09/03/18 0338  WBC 12.5* 13.9* 11.3*  RBC 4.03* 4.18* 4.00*  HGB 12.3* 12.8* 12.1*  HCT 40.3 41.7 40.1  MCV 100.0 99.8 100.3*  MCH 30.5 30.6 30.3  MCHC 30.5 30.7 30.2  RDW 16.4* 16.2* 16.0*  PLT 232 220 220    Cardiac Enzymes Recent Labs  Lab 08/30/18 2126 08/31/18 0549  TROPONINI 0.06* 0.04*   No results for input(s): TROPIPOC in the last 168 hours.   BNP Recent Labs  Lab 08/30/18 2126 08/31/18 0549  BNP 1,293.5* 1,635.9*     DDimer No results for input(s): DDIMER in the last 168 hours.   Radiology    No results found.  Cardiac Studies   Echocardiogram 08/31/18: Study Conclusions - Left ventricle: The cavity size was severely dilated. Wall thickness was normal.  Systolic function was severely reduced. The estimated ejection fraction was in the range of 20% to 25%. - Aortic valve: AV is thickened, calcified with restricted motioni, probably mild to moderate.Reinaldo Raddle atrial fibrillatoin and severe LV dysfunction difficult to relay on gradients to determine severity. - Right ventricle: The cavity size was mildly dilated. Systolic function was moderately reduced. - Pericardium, extracardiac: A trivial pericardial effusion was identified.   Patient Profile     72 y.o. male with a hx of schizophrenia, chronic hypoxic respiratory failure on baseline home O2, DM 2, BPH, dementia, depression and COPDwho is being seen for the evaluation of afib with RVR and acute CHF  Assessment & Plan    Acute on chronic systolic CHF -EF 08-65% with possible wall motion abnormality during admission at [facility], decreased from prior echo in 03/21/2018 with EF 50% -The patient has a history of schizophrenia and medication noncompliance making heart catheterization not a good option.   -Diuresing with furosemide 40 mg IV twice daily.  1.8 L urine output yesterday.  -10 L fluid balance since admission. -Weight is down 15 pounds since admission -Renal function has remained stable with serum creatinine 1.01 today. -Blood pressure continues to be too soft to allow for addition of beta-blocker and ACE inhibitor.  Would try to initiate at least a small dose of ACE inhibitor.  -Hopefully he can get up out of the bed and increase his activity level so that we can better evaluate his heart rate and blood pressure response. He may benefit for PT eval.   Atrial fibrillation -Amiodarone transitioned from IV to oral dosing with 400 mg daily -On a apixaban 5 mg twice daily for stroke risk reduction with CHA2DS2-VASc3 (CHF, age DM)   -Telemetry shows continued A. fib with occasional P waves.  Rate is well controlled.   Acute on chronic hypoxic respiratory  failure in the setting of  acute CHF: -Continues to slowly improve, oxygen is down to 5 L and maintaining sats in the 90s -Possible underlying pulmonary fibrosis with hx of tobacco use.  On empiric antibiotics for fever/possible sepsis per primary team.  There was some concern for possible aspiration. -On nebs and inhalers per primary team   Tobacco abuse -Nicotine patch is ordered but has been refused.  He states that he quit smoking 6 months ago and does not need nicotine patches.  Schizophrenia -Patient is on injectable medication as an outpatient.  Depakote has been added during this admission -Managed per primary team  Diabetes type 2 -Last Hb A1c, 6.4 -Placed on SSI for glucose control while inpatient status   For questions or updates, please contact [facility] Please consult [email or web address] for contact info under        Signed, Berton Bon, NP  09/04/2018, 1:59 PM    -----------------------------------------------------------------------------------------------   History and all data above reviewed.  Patient examined.  I agree with the findings as above.  John Jennings is doing better today and his oxygen has been able to be weaned today from high flow to 3 L. He is able to communicate more easily in the past two days.  GEN:No acute distress,restingand breathing comfortablyon supplemental O2 of 3 L  Cardiac:Irregular rhythm, normal rate, noappreciablemurmurs Respiratory:Clear to auscultation bilaterally. ZO:XWRU, nontender, non-distended  MS:No edema; Right hand post traumaticdeformity. Neuro:Nonfocal  Psych:brighter affect, able to tell me that he would like to wait until he goes home to reevaluate cardiac issues.   All available labs, radiology testing, previous records reviewed. Agree with documented assessment and plan of my colleague as stated above with the following additions or changes:  Principal Problem:   Acute on chronic  respiratory failure with hypoxia (HCC) Active Problems:   Acute systolic CHF (congestive heart failure) (HCC)   COPD with acute exacerbation (HCC)   Fever   Schizophrenia (HCC)   Paroxysmal atrial fibrillation (HCC)   Aortic atherosclerosis (HCC)    Plan: In addition to the plan outlined by Lizabeth Leyden NP above, I would like to transition his IV lasix to oral lasix, and we will use 40 mg BID and monitor volume status. In addition, I believe trialing a small dose of carvedilol may be reasonable in hospital for heart failure therapy, I will start 3.125 mg BID tomorrow morning. We could also consider adding lisinopril or losartan the following day if pressure tolerates, so that he is on GDMT for systolic dysfunction and heart failure.   His daughter April is his medical POA and can assist with transition of care back to the nursing facility when appropriate.  Parke Poisson, MD [facility] 8:28 PM  09/04/2018

## 2018-09-05 ENCOUNTER — Other Ambulatory Visit: Payer: Self-pay

## 2018-09-05 DIAGNOSIS — I480 Paroxysmal atrial fibrillation: Secondary | ICD-10-CM

## 2018-09-05 LAB — CULTURE, BLOOD (ROUTINE X 2)
CULTURE: NO GROWTH
Culture: NO GROWTH
SPECIAL REQUESTS: ADEQUATE
Special Requests: ADEQUATE

## 2018-09-05 LAB — GLUCOSE, CAPILLARY
GLUCOSE-CAPILLARY: 99 mg/dL (ref 70–99)
GLUCOSE-CAPILLARY: 109 mg/dL — AB (ref 70–99)
Glucose-Capillary: 121 mg/dL — ABNORMAL HIGH (ref 70–99)
Glucose-Capillary: 123 mg/dL — ABNORMAL HIGH (ref 70–99)

## 2018-09-05 NOTE — Progress Notes (Signed)
Progress Note  Patient Name: John Jennings Date of Encounter: 09/05/2018  Primary Cardiologist: Gypsy Balsam, MD   Subjective   Admitted with respiratory distress and A. fib with RVR.  Transition from IV to p.o. amiodarone.  He is on Eliquis.  Appears to have converted to sinus rhythm with PACs.  His breathing has improved.  He has diuresed.  He has severe LV dysfunction.  Inpatient Medications    Scheduled Meds: . amiodarone  400 mg Oral BID   Followed by  . [START ON 09/07/2018] amiodarone  200 mg Oral Daily  . apixaban  5 mg Oral BID  . arformoterol  15 mcg Nebulization BID  . aspirin EC  81 mg Oral Daily  . budesonide (PULMICORT) nebulizer solution  0.25 mg Nebulization BID  . carvedilol  3.125 mg Oral BID WC  . chlorhexidine  15 mL Mouth Rinse BID  . divalproex  125 mg Oral Q12H  . divalproex  375 mg Oral QHS  . escitalopram  10 mg Oral Daily  . famotidine  20 mg Oral Daily  . furosemide  40 mg Oral BID  . ipratropium  0.5 mg Nebulization TID   And  . levalbuterol  0.63 mg Nebulization TID  . mouth rinse  15 mL Mouth Rinse q12n4p  . nicotine  21 mg Transdermal Daily  . pantoprazole  40 mg Oral Daily  . polyethylene glycol  17 g Oral Daily  . sodium chloride flush  3 mL Intravenous Q12H   Continuous Infusions:  PRN Meds: acetaminophen, acetaminophen, ALPRAZolam, levalbuterol   Vital Signs    Vitals:   09/05/18 0801 09/05/18 0825 09/05/18 1147 09/05/18 1411  BP:  (!) 121/57 125/82 (!) 100/48  Pulse:    62  Resp:  17 (!) 22 18  Temp:  98.6 F (37 C) (!) 96.9 F (36.1 C) 97.8 F (36.6 C)  TempSrc:  Oral Axillary Axillary  SpO2: 93% (!) 88% 95% 94%  Weight:      Height:        Intake/Output Summary (Last 24 hours) at 09/05/2018 1436 Last data filed at 09/05/2018 1411 Gross per 24 hour  Intake 480 ml  Output 1550 ml  Net -1070 ml   Filed Weights   08/31/18 0300 09/04/18 0434 09/05/18 0524  Weight: 115.7 kg 108.6 kg 108.7 kg    Telemetry      Sinus rhythm with frequent PACs- Personally Reviewed  ECG    Not performed today- Personally Reviewed  Physical Exam   GEN: No acute distress.   Neck: No JVD Cardiac: RRR, no murmurs, rubs, or gallops.  Respiratory: Clear to auscultation bilaterally. GI: Soft, nontender, non-distended  MS: No edema; No deformity. Neuro:  Nonfocal  Psych: Normal affect   Labs    Chemistry Recent Labs  Lab 08/30/18 2126 08/31/18 0549  09/02/18 0257 09/03/18 0338 09/04/18 0347  NA 140 141   < > 141 140 140  K 3.7 3.5   < > 3.7 3.5 3.4*  CL 94* 97*   < > 98 97* 95*  CO2 35* 33*   < > 30 33* 33*  GLUCOSE 88 123*   < > 114* 102* 92  BUN 18 19   < > 12 11 11   CREATININE 0.99 1.21   < > 1.17 1.14 1.01  CALCIUM 8.3* 8.4*   < > 8.2* 8.1* 8.1*  PROT 6.0* 6.0*  --   --   --   --   ALBUMIN  3.2* 3.2*  --   --   --   --   AST 37 40  --   --   --   --   ALT 37 39  --   --   --   --   ALKPHOS 45 47  --   --   --   --   BILITOT 1.0 1.2  --   --   --   --   GFRNONAA >60 58*   < > >60 >60 >60  GFRAA >60 >60   < > >60 >60 >60  ANIONGAP 11 11   < > 13 10 12    < > = values in this interval not displayed.     Hematology Recent Labs  Lab 09/01/18 0317 09/02/18 0257 09/03/18 0338  WBC 12.5* 13.9* 11.3*  RBC 4.03* 4.18* 4.00*  HGB 12.3* 12.8* 12.1*  HCT 40.3 41.7 40.1  MCV 100.0 99.8 100.3*  MCH 30.5 30.6 30.3  MCHC 30.5 30.7 30.2  RDW 16.4* 16.2* 16.0*  PLT 232 220 220    Cardiac Enzymes Recent Labs  Lab 08/30/18 2126 08/31/18 0549  TROPONINI 0.06* 0.04*   No results for input(s): TROPIPOC in the last 168 hours.   BNP Recent Labs  Lab 08/30/18 2126 08/31/18 0549  BNP 1,293.5* 1,635.9*     DDimer No results for input(s): DDIMER in the last 168 hours.   Radiology    No results found.  Cardiac Studies   2D echocardiogram (08/31/2018)  Study Conclusions  - Left ventricle: The cavity size was severely dilated. Wall   thickness was normal. Systolic function was severely  reduced. The   estimated ejection fraction was in the range of 20% to 25%. - Aortic valve: AV is thickened, calcified with restricted motioni,   probably mild to moderate.Reinaldo Raddle atrial fibrillatoin and severe   LV dysfunction difficult to relay on gradients to determine   severity. - Right ventricle: The cavity size was mildly dilated. Systolic   function was moderately reduced. - Pericardium, extracardiac: A trivial pericardial effusion was   identified.   Patient Profile     72 y.o. male admitted in transfer with respiratory distress probably multifactorial related to systolic heart failure, COPD and A. fib with RVR.  He also has schizophrenia.  He has slowly progressed during his hospitalization.  Assessment & Plan    1: Acute on chronic systolic heart failure-EF 20 to 25% by 2D echo down from prior EF of 50% on 03/21/2018.  He was diuresed 10 L with a consistent decrease in weight.  Agree with addition of low-dose carvedilol and ACE inhibitor.  2: Atrial fibrillation- on Eliquis oral anticoagulation.  Rhythm appears to have converted to sinus with PACs on p.o. Amiodarone.  At this point, I do not think he needs cardiac catheterization.  I would pursue outpatient Myoview stress testing to rule out an ischemic etiology and base further recommendations on his results.  See again on Monday.     For questions or updates, please contact [facility] Please consult [email or web address] for contact info under        Signed, Nanetta Batty, MD  09/05/2018, 2:36 PM
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
## 2018-09-06 ENCOUNTER — Inpatient Hospital Stay (HOSPITAL_COMMUNITY): Payer: Medicare Other

## 2018-09-06 DIAGNOSIS — R0602 Shortness of breath: Secondary | ICD-10-CM

## 2018-09-06 LAB — GLUCOSE, CAPILLARY
GLUCOSE-CAPILLARY: 85 mg/dL (ref 70–99)
Glucose-Capillary: 80 mg/dL (ref 70–99)
Glucose-Capillary: 131 mg/dL — ABNORMAL HIGH (ref 70–99)
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
## 2018-09-06 NOTE — Evaluation (Signed)
Occupational Therapy Evaluation Patient Details Name: John Jennings MRN: [id number] DOB: [birth year] Today's Date: 09/06/2018    History of Present Illness HPI: 71yom PMH schizophrenia, chronic hypoxic respiratory failure on 2 L nasal cannula, former smoker, suspected underlying COPD, atrial fibrillation, admitted to [facility] 9/26 for acute hypoxic resp failure, found to have LVEF 20% (new), afib RVR requiring amiodarone, transferred to [facility] for cardiology eval for cath and psych consult for mental health disorder.  Seen by cardiology, initial conservative management recommended given noncompliance, underlying psychiatric disorder.  Early hospitalization complicated by multiple respiratory events requiring rapid response, fever of unclear etiology, concern for possible aspiration.   Clinical Impression   Pt admitted with above. He demonstrates the below listed deficits and will benefit from continued OT to maximize safety and independence with BADLs.  Pt presents to OT with generalized weakness, impaired balance, decreased activity tolerance, and impaired cognition (baseline).   He currently requires mod A for functional transfers using stedy, and mod - max A for ADLs.   Per daughter, pt resides in SNF, and has a gradual decline in function over the past ~ 1 mos.  Prior to this decline, he was able to ambulate short distances with RW, but preferred to self propel in w/c due to DOE with ambulation.  He was able to assist with ADLs (min - mod A).   She indicated he did spend much of his time in bed, even on his good days.    BP 120/72, HR 68.  Pt with brief drop in 02 sats to 82% after transfer, but quickly climbed to 92% (within 40 seconds) with pursed lip breathing.  Recommend return to SNF with PT/OT.  Will follow acutely.       Follow Up Recommendations  SNF;Supervision/Assistance - 24 hour    Equipment Recommendations  None recommended by OT    Recommendations for Other Services        Precautions / Restrictions Precautions Precautions: Fall      Mobility Bed Mobility Overal bed mobility: Needs Assistance Bed Mobility: Supine to Sit     Supine to sit: Min assist;HOB elevated     General bed mobility comments: cues to initiate and assist to fully scoot hips to EOB   Transfers Overall transfer level: Needs assistance   Transfers: Sit to/from Stand;Stand Pivot Transfers Sit to Stand: Mod assist Stand pivot transfers: Mod assist       General transfer comment: assist to power up into standing and to control descent.  Stedy used for the transfer.      Balance Overall balance assessment: Needs assistance Sitting-balance support: Feet supported;Single extremity supported Sitting balance-Leahy Scale: Fair     Standing balance support: Bilateral upper extremity supported Standing balance-Leahy Scale: Poor Standing balance comment: reliant on UE support                            ADL either performed or assessed with clinical judgement   ADL Overall ADL's : Needs assistance/impaired Eating/Feeding: Set up;Sitting;Bed level   Grooming: Wash/dry hands;Wash/dry face;Set up;Sitting   Upper Body Bathing: Moderate assistance;Sitting   Lower Body Bathing: Maximal assistance;Sit to/from stand   Upper Body Dressing : Maximal assistance;Sitting   Lower Body Dressing: Total assistance;Sit to/from stand   Toilet Transfer: Moderate assistance;Stand-pivot;BSC(using stedy )   Toileting- Clothing Manipulation and Hygiene: Total assistance;Sit to/from stand       Functional mobility during ADLs: Moderate assistance General ADL Comments: Pt  limited by fatigue and generalized weakness      Vision Patient Visual Report: No change from baseline       Perception     Praxis      Pertinent Vitals/Pain Pain Assessment: Faces Faces Pain Scale: No hurt     Hand Dominance Right   Extremity/Trunk Assessment Upper Extremity Assessment Upper  Extremity Assessment: Generalized weakness LUE Deficits / Details: 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  Shoulder WFL    Lower Extremity Assessment Lower Extremity Assessment: Defer to PT evaluation   Cervical / Trunk Assessment Cervical / Trunk Assessment: Kyphotic   Communication Communication Communication: No difficulties;Other (comment)   Cognition Arousal/Alertness: Awake/alert Behavior During Therapy: WFL for tasks assessed/performed;Flat affect Overall Cognitive Status: History of cognitive impairments - at baseline                                 General Comments: Per daughter, he is at baseline    General Comments  Pt instructed on pursed lip breathing.  BP 120/72; HR 68; 02 sats dropped briefly to 82% on 4L, but quickly climbed to 92% with pursed lip breathing (uncertain if low reading was accurate due to hand positioning)  daughter present.  Pt very animated and interactive     Exercises     Shoulder Instructions      Home Living Family/patient expects to be discharged to:: Skilled nursing facility                                 Additional Comments: Per pt's daughter, pt has been in SNF for some time       Prior Functioning/Environment Level of Independence: Needs assistance  Gait / Transfers Assistance Needed: Per daughter, pt was ambulatory with RW for short distanances, but mostly used w/c to self propel.  He has had a progressive decline in function over ~ 1 month.  She reports that even before the decline, he spent a good bit of time in bed  ADL's / Homemaking Assistance Needed: Daughter and pt report he has assist with ADLs, but before his decline ~ 1 month ago, he would periodically shower himself             OT Problem List: Decreased strength;Decreased activity tolerance;Impaired balance (sitting and/or standing);Decreased cognition;Decreased knowledge of use of DME or AE;Cardiopulmonary status limiting  activity;Impaired UE functional use      OT Treatment/Interventions: Self-care/ADL training;Energy conservation;DME and/or AE instruction;Therapeutic activities;Cognitive remediation/compensation;Patient/family education;Balance training    OT Goals(Current goals can be found in the care plan section) Acute Rehab OT Goals Patient Stated Goal: to sit up in the chair and eat veggie burger  OT Goal Formulation: With patient/family Time For Goal Achievement: 09/13/18 Potential to Achieve Goals: Good ADL Goals Pt Will Perform Grooming: with set-up;sitting Pt Will Perform Upper Body Bathing: with set-up;with supervision;sitting Pt Will Perform Lower Body Bathing: with mod assist;sit to/from stand Pt Will Perform Upper Body Dressing: with min assist;sitting Pt Will Perform Lower Body Dressing: with mod assist;sit to/from stand Pt Will Transfer to Toilet: with min assist;stand pivot transfer;bedside commode Pt Will Perform Toileting - Clothing Manipulation and hygiene: with mod assist;sit to/from stand  OT Frequency: Min 2X/week   Barriers to D/C: Decreased caregiver support  Pt resides at [facility]        Co-evaluation  AM-PAC PT "6 Clicks" Daily Activity     Outcome Measure Help from another person eating meals?: A Little Help from another person taking care of personal grooming?: A Little Help from another person toileting, which includes using toliet, bedpan, or urinal?: A Lot Help from another person bathing (including washing, rinsing, drying)?: A Lot Help from another person to put on and taking off regular upper body clothing?: A Lot Help from another person to put on and taking off regular lower body clothing?: Total 6 Click Score: 13   End of Session Equipment Utilized During Treatment: Oxygen(stedy ) Nurse Communication: Mobility status;Need for lift equipment  Activity Tolerance: Patient tolerated treatment well Patient left: in chair;with call bell/phone  within reach;with chair alarm set;with family/visitor present  OT Visit Diagnosis: Unsteadiness on feet (R26.81);Muscle weakness (generalized) (M62.81)                Time: 1610-9604 OT Time Calculation (min): 52 min Charges:  OT General Charges $OT Visit: 1 Visit OT Evaluation $OT Eval Moderate Complexity: 1 Mod OT Treatments $Therapeutic Activity: 23-37 mins  Jeani Hawking, OTR/L Acute Rehabilitation Services Pager [phone] Office [phone]   Jeani Hawking M 09/06/2018, 4:06 PM

## 2018-09-07 DIAGNOSIS — I700 Atherosclerosis of aorta: Secondary | ICD-10-CM

## 2018-09-07 LAB — GLUCOSE, CAPILLARY: GLUCOSE-CAPILLARY: 101 mg/dL — AB (ref 70–99)

## 2018-09-07 MED ORDER — METOPROLOL TARTRATE 25 MG PO TABS: 25.0000 mg | ORAL_TABLET | Freq: Two times a day (BID) | ORAL | 0 refills | Status: AC

## 2018-09-07 MED ORDER — FUROSEMIDE 40 MG PO TABS: 40.0000 mg | ORAL_TABLET | Freq: Two times a day (BID) | ORAL | 0 refills | Status: AC

## 2018-09-07 MED ORDER — APIXABAN 5 MG PO TABS: 5.0000 mg | ORAL_TABLET | Freq: Two times a day (BID) | ORAL | 0 refills | Status: AC

## 2018-09-07 MED ORDER — AMIODARONE HCL 200 MG PO TABS: 200.0000 mg | ORAL_TABLET | Freq: Every day | ORAL | 0 refills | Status: AC

## 2018-09-07 MED ORDER — METOPROLOL TARTRATE 25 MG PO TABS
25.0000 mg | ORAL_TABLET | Freq: Two times a day (BID) | ORAL | Status: DC
  Administered 2018-09-07: 25 mg via ORAL
  Filled 2018-09-07: qty 1

## 2018-09-07 NOTE — Care Management Important Message (Signed)
Important Message  Patient Details  Name: John Jennings MRN: [id number] Date of Birth: 06-May-1946   Medicare Important Message Given:  Yes    Kamelia Lampkins P Birl Lobello 09/07/2018, 2:41 PM

## 2018-09-07 NOTE — [place] FL2 (Addendum)
[place] MEDICAID FL2 LEVEL OF CARE SCREENING TOOL     IDENTIFICATION  Patient Name: John Jennings Birthdate: November 16, 1946 Sex: male Admission Date (Current Location): 08/30/2018  [facility] and [place] Number:  [employer] and Address:  [facility], [address], [place], [place] [address]      Provider Number: [id number]  Attending Physician Name and Address:  Standley Brooking, MD  Relative Name and Phone Number:       Current Level of Care: Hospital Recommended Level of Care: Assisted Living Facility Prior Approval Number:    Date Approved/Denied:   PASRR Number:    Discharge Plan: Other (Comment)(ALF)    Current Diagnoses: Patient Active Problem List   Diagnosis Date Noted  . Shortness of breath   . Aortic atherosclerosis (HCC) 09/02/2018  . Paroxysmal atrial fibrillation (HCC)   . Acute systolic CHF (congestive heart failure) (HCC) 08/31/2018  . Acute on chronic respiratory failure with hypoxia (HCC) 08/31/2018  . COPD with acute exacerbation (HCC) 08/31/2018  . Fever 08/31/2018  . Schizophrenia (HCC) 08/31/2018  . Heart failure (HCC) 08/30/2018    Orientation RESPIRATION BLADDER Height & Weight     Self, Time, Situation, Place  O2(see DC summary) Continent Weight: 236 lb 1.8 oz (107.1 kg) Height:  5\' 8"  (172.7 cm)  BEHAVIORAL SYMPTOMS/MOOD NEUROLOGICAL BOWEL NUTRITION STATUS      Continent Diet(heart healthy)  AMBULATORY STATUS COMMUNICATION OF NEEDS Skin   Limited Assist Verbally Normal                       Personal Care Assistance Level of Assistance  Bathing, Feeding, Dressing Bathing Assistance: Limited assistance Feeding assistance: Independent Dressing Assistance: Limited assistance     Functional Limitations Info  Sight, Hearing, Speech Sight Info: Adequate Hearing Info: Impaired Speech Info: Adequate    SPECIAL CARE FACTORS FREQUENCY  PT (By licensed PT), OT (By licensed OT)     PT Frequency:  2x/wk with home health OT Frequency: 2x/wk with home health            Contractures Contractures Info: Not present    Additional Factors Info  Code Status, Allergies, Psychotropic Code Status Info: DNR Allergies Info: NKA Psychotropic Info: Depakote sprinkle 125mg  every 12 hours; Depakote sprinkle 375mg  daily at bed; Lexapro 10 mg daily         Current Medications (09/07/2018):  This is the current hospital active medication list Current Facility-Administered Medications  Medication Dose Route Frequency Provider Last Rate Last Dose  . acetaminophen (TYLENOL) suppository 650 mg  650 mg Rectal Q4H PRN Standley Brooking, MD   650 mg at 08/31/18 1100  . acetaminophen (TYLENOL) tablet 650 mg  650 mg Oral Q6H PRN Standley Brooking, MD   650 mg at 09/01/18 2230  . ALPRAZolam Prudy Feeler) tablet 0.5 mg  0.5 mg Oral BID PRN Standley Brooking, MD      . amiodarone (PACERONE) tablet 200 mg  200 mg Oral Daily Standley Brooking, MD   200 mg at 09/07/18 1009  . apixaban (ELIQUIS) tablet 5 mg  5 mg Oral BID Standley Brooking, MD   5 mg at 09/07/18 1009  . arformoterol (BROVANA) nebulizer solution 15 mcg  15 mcg Nebulization BID Standley Brooking, MD   15 mcg at 09/07/18 0956  . budesonide (PULMICORT) nebulizer solution 0.25 mg  0.25 mg Nebulization BID Standley Brooking, MD   0.25 mg at 09/07/18  4098  . chlorhexidine (PERIDEX) 0.12 % solution 15 mL  15 mL Mouth Rinse BID Standley Brooking, MD   15 mL at 09/07/18 1010  . divalproex (DEPAKOTE SPRINKLE) capsule 125 mg  125 mg Oral Q12H Standley Brooking, MD   125 mg at 09/07/18 1009  . divalproex (DEPAKOTE SPRINKLE) capsule 375 mg  375 mg Oral QHS Standley Brooking, MD   375 mg at 09/06/18 2119  . escitalopram (LEXAPRO) tablet 10 mg  10 mg Oral Daily Standley Brooking, MD   10 mg at 09/07/18 1009  . famotidine (PEPCID) tablet 20 mg  20 mg Oral Daily Standley Brooking, MD   20 mg at 09/07/18 1011  . furosemide (LASIX) tablet 40 mg  40 mg Oral  BID Standley Brooking, MD   40 mg at 09/07/18 1010  . ipratropium (ATROVENT) nebulizer solution 0.5 mg  0.5 mg Nebulization TID Standley Brooking, MD   0.5 mg at 09/07/18 1191   And  . levalbuterol (XOPENEX) nebulizer solution 0.63 mg  0.63 mg Nebulization TID Standley Brooking, MD   0.63 mg at 09/07/18 0948  . levalbuterol (XOPENEX) nebulizer solution 0.63 mg  0.63 mg Nebulization Q2H PRN Standley Brooking, MD      . MEDLINE mouth rinse  15 mL Mouth Rinse q12n4p Standley Brooking, MD   15 mL at 09/06/18 1707  . metoprolol tartrate (LOPRESSOR) tablet 25 mg  25 mg Oral BID Croitoru, Mihai, MD   25 mg at 09/07/18 1009  . nicotine (NICODERM CQ - dosed in mg/24 hours) patch 21 mg  21 mg Transdermal Daily Standley Brooking, MD   21 mg at 09/07/18 1010  . pantoprazole (PROTONIX) EC tablet 40 mg  40 mg Oral Daily Standley Brooking, MD   40 mg at 09/07/18 1009  . polyethylene glycol (MIRALAX / GLYCOLAX) packet 17 g  17 g Oral Daily Standley Brooking, MD   17 g at 09/05/18 0941  . sodium chloride flush (NS) 0.9 % injection 3 mL  3 mL Intravenous Q12H Standley Brooking, MD   3 mL at 09/06/18 2119     Discharge Medications: STOP taking these medications   aspirin 81 MG chewable tablet   diltiazem 120 MG 24 hr capsule Commonly known as:  DILACOR XR   Paliperidone 1.5 MG Tb24     TAKE these medications   acetaminophen 500 MG tablet Commonly known as:  TYLENOL Take 1,000 mg by mouth 2 (two) times daily.   acetaminophen 325 MG tablet Commonly known as:  TYLENOL Take 650 mg by mouth every 6 (six) hours as needed for mild pain.   albuterol (2.5 MG/3ML) 0.083% nebulizer solution Commonly known as:  PROVENTIL Take 2.5 mg by nebulization every 2 (two) hours as needed for wheezing or shortness of breath.   ALPRAZolam 0.5 MG tablet Commonly known as:  XANAX Take 0.5 mg by mouth 2 (two) times daily as needed for anxiety.   ALPRAZolam 0.5 MG tablet Commonly known as:  XANAX Take  0.5 mg by mouth every morning.   amiodarone 200 MG tablet Commonly known as:  PACERONE Take 1 tablet (200 mg total) by mouth daily. Start taking on:  09/08/2018   apixaban 5 MG Tabs tablet Commonly known as:  ELIQUIS Take 1 tablet (5 mg total) by mouth 2 (two) times daily.   divalproex 125 MG capsule Commonly known as:  DEPAKOTE SPRINKLE Take 250-375 mg by mouth See admin instructions.  Take 2 capsules (250 mg) at 8 am and 2pm. Take 3 capsules (375 mg) at bedtime   escitalopram 10 MG tablet Commonly known as:  LEXAPRO Take 10 mg by mouth daily.   famotidine 20 MG tablet Commonly known as:  PEPCID Take 20 mg by mouth daily.   furosemide 40 MG tablet Commonly known as:  LASIX Take 1 tablet (40 mg total) by mouth 2 (two) times daily. What changed:  when to take this   INVEGA SUSTENNA 234 MG/1.5ML Susy injection Generic drug:  paliperidone Inject 234 mg into the muscle every 28 (twenty-eight) days.   ipratropium-albuterol 0.5-2.5 (3) MG/3ML Soln Commonly known as:  DUONEB Take 3 mLs by nebulization every 6 (six) hours.   metoprolol tartrate 25 MG tablet Commonly known as:  LOPRESSOR Take 1 tablet (25 mg total) by mouth 2 (two) times daily.   polyethylene glycol packet Commonly known as:  MIRALAX / GLYCOLAX Take 17 g by mouth daily.   potassium chloride 10 MEQ tablet Commonly known as:  K-DUR,KLOR-CON Take 10 mEq by mouth daily.     Relevant Imaging Results:  Relevant Lab Results:   Additional Information SS#: [id number]  Baldemar Lenis, LCSW

## 2018-09-07 NOTE — Progress Notes (Signed)
Discharge to: [facility] Anticipated discharge date: 09/07/18 Family notified: Yes, at bedside Transportation by: [facility]  Report #: [phone]  CSW signing off.  Blenda Nicely LCSW [phone]
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
## 2018-09-07 NOTE — Progress Notes (Signed)
Physical Therapy Treatment Patient Details Name: John Jennings MRN: [id number] DOB: 18-Jan-1946 Today's Date: 09/07/2018    History of Present Illness HPI: 71yom PMH schizophrenia, chronic hypoxic respiratory failure on 2 L nasal cannula, former smoker, suspected underlying COPD, atrial fibrillation, admitted to [facility] 9/26 for acute hypoxic resp failure, found to have LVEF 20% (new), afib RVR requiring amiodarone, transferred to [facility] for cardiology eval for cath and psych consult for mental health disorder.  Seen by cardiology, initial conservative management recommended given noncompliance, underlying psychiatric disorder.  Early hospitalization complicated by multiple respiratory events requiring rapid response, fever of unclear etiology, concern for possible aspiration.    PT Comments    Pt making good progress with mobility. Recommend return to ALF with assist for mobility and HHPT to continue to progress his mobility.    Follow Up Recommendations  Home health PT;Other (comment)(return to ALF with HHPT)     Equipment Recommendations  None recommended by PT    Recommendations for Other Services       Precautions / Restrictions Precautions Precautions: Fall Restrictions Weight Bearing Restrictions: No    Mobility  Bed Mobility Overal bed mobility: Needs Assistance Bed Mobility: Supine to Sit     Supine to sit: Supervision;HOB elevated     General bed mobility comments: incr time and use of rail  Transfers Overall transfer level: Needs assistance Equipment used: 1 person hand held assist Transfers: Sit to/from Stand;Stand Pivot Transfers Sit to Stand: Min assist;+2 safety/equipment Stand pivot transfers: Min assist;+2 safety/equipment       General transfer comment: Assist for balance and safety  Ambulation/Gait Ambulation/Gait assistance: Min assist;+2 safety/equipment Gait Distance (Feet): 3 Feet Assistive device: 1 person hand held assist Gait  Pattern/deviations: Step-through pattern;Decreased step length - right;Decreased step length - left;Shuffle Gait velocity: decr Gait velocity interpretation: <1.31 ft/sec, indicative of household ambulator General Gait Details: Assist for balance and support. Pt self limiting with distance   Stairs             Wheelchair Mobility    Modified Rankin (Stroke Patients Only)       Balance Overall balance assessment: Needs assistance Sitting-balance support: Feet supported;No upper extremity supported Sitting balance-Leahy Scale: Good     Standing balance support: Single extremity supported Standing balance-Leahy Scale: Poor Standing balance comment: min assist for static standing                            Cognition Arousal/Alertness: Awake/alert Behavior During Therapy: WFL for tasks assessed/performed;Flat affect Overall Cognitive Status: No family/caregiver present to determine baseline cognitive functioning                                        Exercises      General Comments        Pertinent Vitals/Pain Pain Assessment: Faces Faces Pain Scale: Hurts a little bit Pain Location: Ear from O2 tubing Pain Descriptors / Indicators: Grimacing Pain Intervention(s): Monitored during session;Other (comment)(placed 4x4 around O2 tubing)    Home Living                      Prior Function            PT Goals (current goals can now be found in the care plan section) Progress towards PT goals: Progressing toward goals  Frequency    Min 2X/week      PT Plan      Co-evaluation              AM-PAC PT "6 Clicks" Daily Activity  Outcome Measure  Difficulty turning over in bed (including adjusting bedclothes, sheets and blankets)?: None Difficulty moving from lying on back to sitting on the side of the bed? : A Little Difficulty sitting down on and standing up from a chair with arms (e.g., wheelchair, bedside  commode, etc,.)?: Unable Help needed moving to and from a bed to chair (including a wheelchair)?: A Little Help needed walking in hospital room?: A Lot Help needed climbing 3-5 steps with a railing? : Total 6 Click Score: 14    End of Session Equipment Utilized During Treatment: Oxygen Activity Tolerance: Patient tolerated treatment well Patient left: with call bell/phone within reach;in chair;with chair alarm set Nurse Communication: Mobility status PT Visit Diagnosis: Unsteadiness on feet (R26.81);Other abnormalities of gait and mobility (R26.89);Muscle weakness (generalized) (M62.81)     Time: 1610-9604 PT Time Calculation (min) (ACUTE ONLY): 14 min  Charges:  $Therapeutic Activity: 8-22 mins                     [facility] PT Acute Rehabilitation Services Pager [phone] Office [phone]    Angelina Ok [facility] 09/07/2018, 11:33 AM

## 2018-09-07 NOTE — Progress Notes (Signed)
CSW following for discharge. Per chart review, patient and daughter would prefer patient to return to previous ALF environment. CSW contacted [facility] to discuss, they would like to review patient's recent therapy notes. CSW faxed updates to Burna Mortimer at [facility] for her to review to make sure they can take the patient back. Per Burna Mortimer, they may need to come to the hospital to do an evaluation of him to ensure he's safe to return and will not need a higher level of care at this time.  CSW to follow.  Blenda Nicely, [place] Clinical Social Worker [phone]
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
## 2018-12-02 DEATH — deceased

## 2019-04-12 IMAGING — DX DG CHEST 2V
2 series · 3 of 3 positions shown · non-contrast
Comparison: 08/31/2018

CLINICAL DATA: Respiratory failure

EXAM:
CHEST - 2 VIEW

[Series 2: chest lat · 0.14mm/px · 2 of 2 slices shown]
[im 1/2]
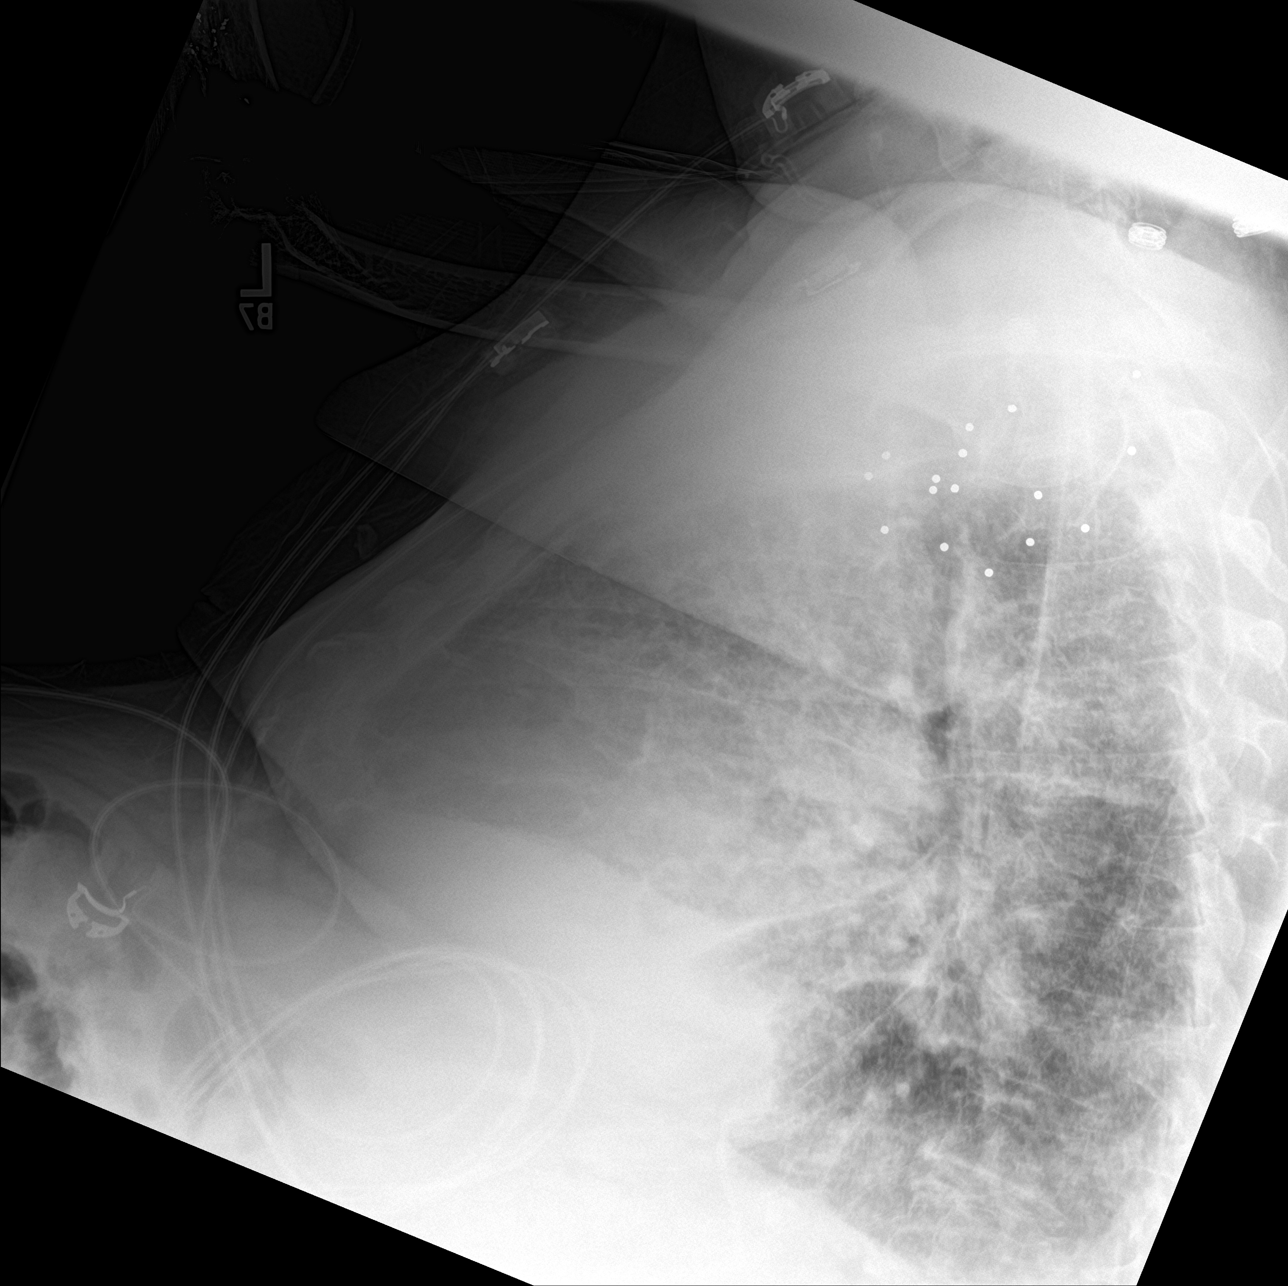
[im 2/2]
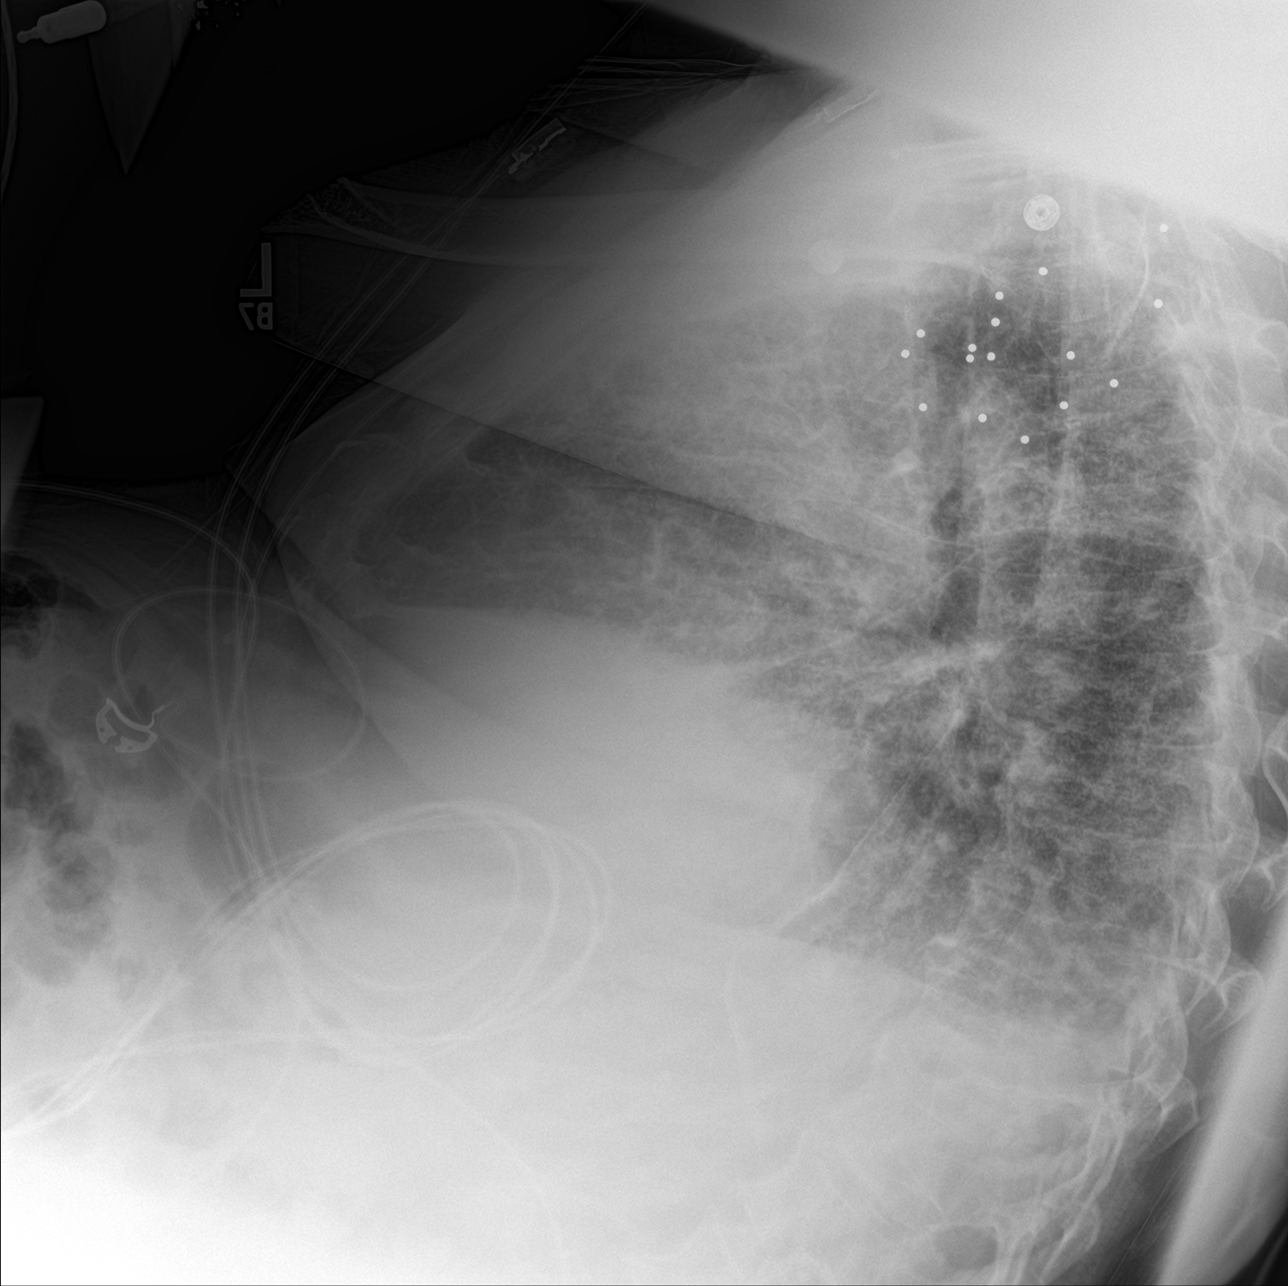

[chest ap]
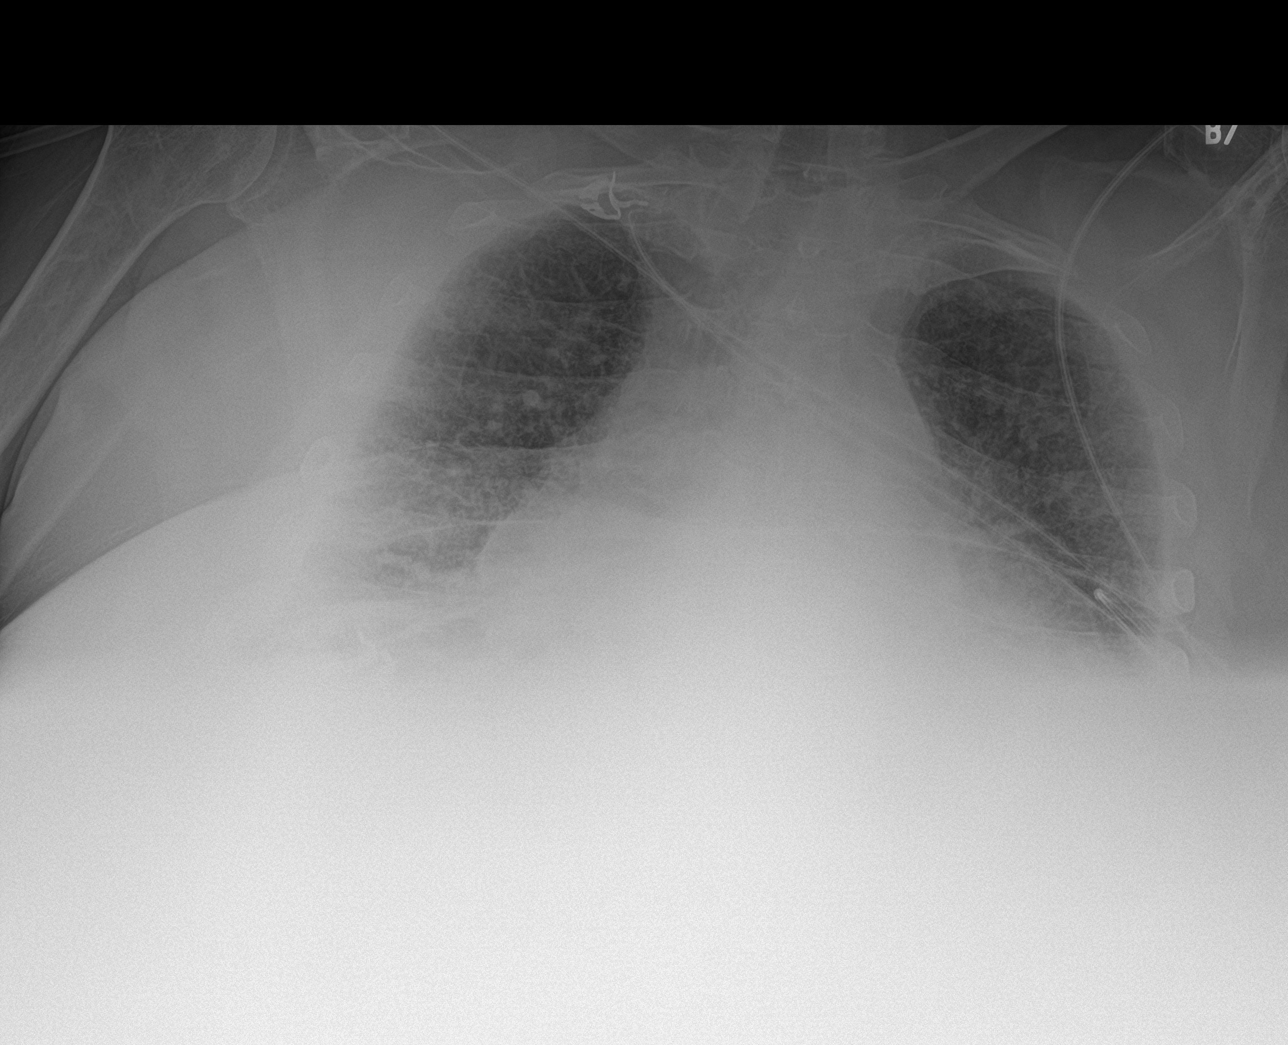

[3 of 3 positions shown; findings below may reference images not displayed]

FINDINGS: Cardiomegaly with vascular congestion. Very low lung volumes with
diffuse interstitial and airspace opacities, likely edema. Small
effusions. Bibasilar atelectasis.
IMPRESSION: Very low lung volumes with cardiomegaly, vascular congestion and
mild pulmonary edema.

Small bilateral effusions with bibasilar atelectasis.
